# Patient Record
Sex: Female | Born: 1955
Health system: Southern US, Community
[De-identification: ages and names within clinical notes are randomized; demographics above are authoritative.]

## PROBLEM LIST (undated history)

## (undated) DIAGNOSIS — E039 Hypothyroidism, unspecified: Secondary | ICD-10-CM

## (undated) DIAGNOSIS — F32A Depression, unspecified: Secondary | ICD-10-CM

## (undated) DIAGNOSIS — R1031 Right lower quadrant pain: Secondary | ICD-10-CM

## (undated) DIAGNOSIS — K219 Gastro-esophageal reflux disease without esophagitis: Secondary | ICD-10-CM

## (undated) DIAGNOSIS — M1990 Unspecified osteoarthritis, unspecified site: Secondary | ICD-10-CM

## (undated) DIAGNOSIS — C801 Malignant (primary) neoplasm, unspecified: Secondary | ICD-10-CM

## (undated) DIAGNOSIS — I499 Cardiac arrhythmia, unspecified: Secondary | ICD-10-CM

## (undated) DIAGNOSIS — F329 Major depressive disorder, single episode, unspecified: Secondary | ICD-10-CM

## (undated) HISTORY — PX: TUBAL LIGATION: SHX77

## (undated) HISTORY — PX: CYST REMOVAL NECK: SHX6281

## (undated) HISTORY — DX: Right lower quadrant pain: R10.31

## (undated) HISTORY — PX: APPENDECTOMY: SHX54

---

## 2002-06-07 ENCOUNTER — Ambulatory Visit (HOSPITAL_COMMUNITY): Admission: RE | Admit: 2002-06-07 | Discharge: 2002-06-07 | Payer: Self-pay | Admitting: Family Medicine

## 2002-06-07 ENCOUNTER — Encounter: Payer: Self-pay | Admitting: Family Medicine

## 2002-09-12 HISTORY — PX: SUPRAVENTRICULAR TACHYCARDIA ABLATION: SHX6106

## 2002-09-13 ENCOUNTER — Ambulatory Visit (HOSPITAL_COMMUNITY): Admission: RE | Admit: 2002-09-13 | Discharge: 2002-09-14 | Payer: Self-pay | Admitting: Internal Medicine

## 2006-02-01 ENCOUNTER — Ambulatory Visit (HOSPITAL_COMMUNITY): Admission: RE | Admit: 2006-02-01 | Discharge: 2006-02-01 | Payer: Self-pay | Admitting: Family Medicine

## 2007-07-01 ENCOUNTER — Other Ambulatory Visit: Admission: RE | Admit: 2007-07-01 | Discharge: 2007-07-01 | Payer: Self-pay | Admitting: Obstetrics and Gynecology

## 2009-04-13 HISTORY — PX: THYROIDECTOMY, PARTIAL: SHX18

## 2009-08-06 ENCOUNTER — Ambulatory Visit (HOSPITAL_COMMUNITY): Admission: RE | Admit: 2009-08-06 | Discharge: 2009-08-06 | Payer: Self-pay | Admitting: Obstetrics and Gynecology

## 2009-08-06 ENCOUNTER — Encounter: Payer: Self-pay | Admitting: Orthopedic Surgery

## 2009-08-21 ENCOUNTER — Other Ambulatory Visit: Admission: RE | Admit: 2009-08-21 | Discharge: 2009-08-21 | Payer: Self-pay | Admitting: Obstetrics and Gynecology

## 2009-08-28 ENCOUNTER — Ambulatory Visit (HOSPITAL_COMMUNITY): Admission: RE | Admit: 2009-08-28 | Discharge: 2009-08-28 | Payer: Self-pay | Admitting: Obstetrics & Gynecology

## 2009-09-10 ENCOUNTER — Encounter: Payer: Self-pay | Admitting: Orthopedic Surgery

## 2009-09-11 ENCOUNTER — Ambulatory Visit: Payer: Self-pay | Admitting: Orthopedic Surgery

## 2009-09-11 DIAGNOSIS — M549 Dorsalgia, unspecified: Secondary | ICD-10-CM | POA: Insufficient documentation

## 2009-09-13 ENCOUNTER — Ambulatory Visit (HOSPITAL_COMMUNITY): Admission: RE | Admit: 2009-09-13 | Discharge: 2009-09-13 | Payer: Self-pay | Admitting: Obstetrics & Gynecology

## 2009-10-03 ENCOUNTER — Telehealth: Payer: Self-pay | Admitting: Orthopedic Surgery

## 2009-12-09 ENCOUNTER — Encounter (INDEPENDENT_AMBULATORY_CARE_PROVIDER_SITE_OTHER): Payer: Self-pay | Admitting: Surgery

## 2009-12-09 ENCOUNTER — Ambulatory Visit (HOSPITAL_COMMUNITY): Admission: RE | Admit: 2009-12-09 | Discharge: 2009-12-10 | Payer: Self-pay | Admitting: Surgery

## 2010-05-13 NOTE — Assessment & Plan Note (Signed)
Summary: LOW BACK PAIN XR AT [facility]   Vital Signs:  Patient profile:   55 year old female Height:      71 inches Weight:      174 pounds Pulse rate:   80 / minute Resp:     18 per minute  Vitals Entered By: Fuller Canada MD (September 11, 2009 3:21 PM)  Visit Type:  new patient Referring Provider:  Victorino Dike PA at [facility] Primary Provider:  [facility]  CC:  low back pain.  History of Present Illness: This is a 55 year old female with recent onset of lower back pain without any radiation.  It comes and goes it still mild to moderate in severity.  It came on gradually.  It started 4 weeks ago and occasionally will radiate into the upper LEFT leg.  No numbness no weakness.  Her pain is relieved with Mobic and Tylenol.  No injury.  Xrays at [facility] of the whole spine 08/06/09.  Meds: Meloxicam and Tylenol.        Allergies (verified): 1)  ! 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  The review of systems is negative for Cardiovascular, Respiratory, Gastrointestinal, Genitourinary, Neurologic, Musculoskeletal, Endocrine, Psychiatric, Skin, HEENT, Immunology, and Hemoatologic.  Physical Exam  Skin:  intact without lesions or rashes Inguinal Nodes:  no significant adenopathy Psych:  alert and cooperative; normal mood and affect; normal attention span and concentration   Detailed Back/Spine Exam  General:    Well-developed, well-nourished, in no acute distress; alert and oriented x 3.    Gait:    Normal heel-toe gait pattern bilaterally.    Skin:    Intact with no erythema; no scarring.    Inspection:    normal  Palpation:  normal lumbar spine palpation  Vascular:    dorsalis pedis and posterior tibial pulses 2+ and symmetric, capillary refill < 2 seconds, normal hair pattern, no evidence of ischemia.   Thoracic Exam:  Inspection-deformity:    Normal Palpation-spinal tenderness:  Normal Shoulder Prominence    Location: normal Pelvis Prominence    Location: none  Lumbosacral Exam:  Inspection-deformity:    Normal Palpation-spinal tenderness:  Normal Range of Motion:    Forward Flexion:   60 degrees    Hyperextension:   25 degrees    Schober's:        >6 cm    Right Lateral Bend:   25 degrees    Left Lateral Bend:   25 degrees Squatting:  normal Lying Straight Leg Raise:    Right:  negative    Left:  negative Sitting Straight Leg Raise:    Right:  negative    Left:  negative Reverse Straight Leg Raise:    Right:  negative    Left:  negative Contralateral Straight Leg Raise:    Right:  negative    Left:  negative Sciatic Notch:    There is no sciatic notch tenderness. Toe Walking:    Right:  normal    Left:  normal Heel Walking:    Right:  normal    Left:  normal Patrick's Maneuver:    Right:  negative    Left:  negative Fabere Test:  Right:  negative    Left:  negative   Impression & Recommendations:  Problem # 1:  BACK PAIN (ICD-724.5) Assessment New multiple views of the thoracic or lumbar spine were obtained at the hospital and they were all normal  I think she just has some garden-variety routine lower back pain.  I advised her if she gets numbness and tingling or pain radiating into the foot or the lower portion of the leg and that could be a sign of disc herniation and I would like to see her again for that  Orders: Physical Therapy Referral (PT) New Patient Level III ([address]109)  Patient Instructions: 1)  PHYSICAL THERAPIST FOR BACK PAIN HOME EXERCISE PROGRAM , 1 VISIT  2)  Please schedule a follow-up appointment as needed.

## 2010-05-13 NOTE — Progress Notes (Signed)
Summary: cannot go for her 1 session of PT until august?!  Phone Note Other Incoming Call back at PT [facility]   Summary of Call: said pt cannot come in for her 1 session of PT until August, just FYI Initial call taken by: Ether Griffins,  October 03, 2009 10:52 AM

## 2010-05-13 NOTE — Letter (Signed)
Summary: History form  History form   Imported By: Jacklynn Ganong 09/19/2009 15:15:20  _____________________________________________________________________  External Attachment:    Type:   Image     Comment:   External Document

## 2010-06-27 LAB — CBC
HCT: 39.8 % (ref 36.0–46.0)
MCH: 32.2 pg (ref 26.0–34.0)
MCHC: 34.4 g/dL (ref 30.0–36.0)
MCV: 93.7 fL (ref 78.0–100.0)
Platelets: 177 10*3/uL (ref 150–400)
RBC: 4.25 MIL/uL (ref 3.87–5.11)
RDW: 12.1 % (ref 11.5–15.5)

## 2010-06-27 LAB — BASIC METABOLIC PANEL
CO2: 30 mEq/L (ref 19–32)
Calcium: 9.3 mg/dL (ref 8.4–10.5)
Chloride: 105 mEq/L (ref 96–112)
Creatinine, Ser: 0.89 mg/dL (ref 0.4–1.2)
Glucose, Bld: 99 mg/dL (ref 70–99)
Sodium: 142 mEq/L (ref 135–145)

## 2010-06-27 LAB — URINALYSIS, ROUTINE W REFLEX MICROSCOPIC
Bilirubin Urine: NEGATIVE
Specific Gravity, Urine: 1.015 (ref 1.005–1.030)
pH: 7 (ref 5.0–8.0)

## 2010-06-27 LAB — PROTIME-INR
INR: 1.04 (ref 0.00–1.49)
Prothrombin Time: 13.8 seconds (ref 11.6–15.2)

## 2010-06-27 LAB — DIFFERENTIAL
Basophils Absolute: 0 10*3/uL (ref 0.0–0.1)
Eosinophils Relative: 1 % (ref 0–5)
Lymphs Abs: 1.9 10*3/uL (ref 0.7–4.0)
Monocytes Absolute: 0.3 10*3/uL (ref 0.1–1.0)

## 2010-06-27 LAB — SURGICAL PCR SCREEN: MRSA, PCR: NEGATIVE

## 2010-08-29 NOTE — Op Note (Signed)
NAME:  Colleen Torres, Colleen Torres                         ACCOUNT NO.:  [id number]   MEDICAL RECORD NO.:  [id number]                   PATIENT TYPE:  OIB   LOCATION:  2899                                 FACILITY:  [facility]   PHYSICIAN:  Doylene Canning. Ladona Ridgel, M.D.               DATE OF BIRTH:  November 29, 1955   DATE OF PROCEDURE:  09/13/2002  DATE OF DISCHARGE:                                 OPERATIVE REPORT   PROCEDURE:  Electrophysiologic study and RF catheter ablation of  supraventricular tachycardia.   INTRODUCTION:  The patient is a 55 year old woman who has been otherwise  healthy.  She has had tachypalpitations for several years.  Her heart rate  has been routinely elevated whenever she has her blood pressure checked with  heart rates in the 110 to 140 range.  She has palpitations with exertion,  increasing fatigue, and dyspnea.  She was subsequently found to have fairly  incessant SVT and is now referred for electrophysiologic study and catheter  ablation.  Of note, her EKG demonstrates a long RP tachycardia with inverted  T waves in the inferior leads.   DESCRIPTION OF PROCEDURE:  After informed consent was obtained, the patient  was taken to the diagnostic EP lab in the fasting state.  After the usual  preparation and draping, intravenous fentanyl and midazolam was given for  sedation.  A 6 French hexapolar catheter was inserted percutaneously in the  right jugular vein and advanced to the pulmonary sinus.  A 5 French  quadripolar catheter was inserted percutaneously in the right femoral vein  and advanced to the RV apex.  A 5 French quadripolar catheter was inserted  percutaneously in the right femoral vein and advanced to the His-bundle  region.  After measurement of the basic intervals, mapping was carried out  with the patient in incessant SVT.  This demonstrated a narrow QRS  tachycardia with a long RP duration at a cycle length of about 550  milliseconds.  The earliest atrial activation  was at the CS os.  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  Initial attempts at pacing  the ventricle and the atrium resulted in incessant initiation of SVT.  The  tachycardia would start spontaneously and stop spontaneously.  At this  point, the ablation catheter was maneuvered into the floor of the CS os just  outside the ostium in the coronary sinus on the inferior lip. A single RF  energy application delivered at this location resulted in termination of the  SVT.  A second bonus RF energy application was delivered and rapid atrial  and ventricular pacing and programmed atrial and ventricular stimulation was  then carried out.  After catheterization, there was no inducible  SVT.  The  catheters were then removed, hemostasis was assured, and the patient  returned to her room in satisfactory condition.   COMPLICATIONS:  There were no immediate procedure complications.   RESULTS:  A.  Baseline EKG.  The baseline EKG demonstrates SVT at 110 beats  per minute with a long RP interval.  B. Mapping of the P waves demonstrated negative P waves in the inferior  leads.  C. Rapid ventricular pacing.  Rapid ventricular pacing after catheter  ablation demonstrated a VA Wenckebach cycle length of 520 milliseconds.  Following catheter ablation, the atrial activation sequence was midline and  decremental.  D. Programmed ventricular stimulation.  Programmed ventricular stimulation  was carried out from the RV apex at basic drive cycle length of 045  milliseconds.  The S1 and S2 interval was stepwise decreased from 440  milliseconds down to 480 milliseconds where the retrograde AV nodal ERP was  observed.  During programmed ventricular stimulation after catheter  ablation,  there were no inducible SVT.  E. Rapid atrial pacing.  Rapid atrial pacing was carried out from the  coronary sinus demonstrated an AV Wenckebach cycle length of 370  milliseconds.  During rapid atrial pacing, the PR interval remained less  than the RR interval and there was no inducible SVT.  F. Programmed atrial stimulation.  Programmed atrial stimulation was carried  out from the coronary sinus at a basic drive cycle length of 409  milliseconds. The S1 and S2 interval was stepwise decreased from 440  milliseconds down to 370 milliseconds resulting in the AV nodal ERP.  During  programmed atrial stimulation, there were _______ and echo beats, but no  inducible SVT following catheter ablation.  G. Arrhythmias observed:  1) PJRT initiation present at time of EP study,  duration sustained, cycle length 530 milliseconds, duration sustained,  termination with catheter ablation.  Induction was also with programmed  atrial stimulation.  H. Mapping.  Mapping of the earliest atrial activation occurred in the os of  the coronary sinus just inferior to this region in the posteroseptal space  of the right atrium.  A. RF energy application.  A single RF energy application resulted in     termination of SVT.  A single bonus RF energy application was delivered     as well.  The patient was observed for approximately 50 minutes.   CONCLUSION:  The study demonstrates incessant and easily inducible PJRT  (permanent form of junctional reciprocating tachycardia).  This demonstrates  successful RF catheter ablation with a total of two RF energy applications  delivered at the posterior septal space resulting in termination of the  tachycardia, restoration of sinus rhythm, and resulting in rendering the  tachycardia noninducible.                                               Doylene Canning. Ladona Ridgel, M.D.    GWT/MEDQ  D:  09/13/2002  T:  09/13/2002  Job:  [id number]   cc:   Kem Boroughs, M.D. [phone] [address],  [address]  [place]  [place] [address]  Fax: [phone]   Kirk Ruths, M.D.  [address]  [place]  [place] [address]  Fax: [phone]   Kathrine Cords, R.N. [facility]

## 2010-08-29 NOTE — Discharge Summary (Signed)
   NAME:  Colleen Torres, Colleen Torres                         ACCOUNT NO.:  [id number]   MEDICAL RECORD NO.:  [id number]                   PATIENT TYPE:  OIB   LOCATION:  3735                                 FACILITY:  [facility]   PHYSICIAN:  Doylene Canning. Ladona Ridgel, M.D.               DATE OF BIRTH:  03-19-1956   DATE OF ADMISSION:  09/13/2002  DATE OF DISCHARGE:  09/14/2002                                 DISCHARGE SUMMARY   PRIMARY DIAGNOSIS:  Supraventricular tachycardia (SVT.   HISTORY AND HOSPITAL COURSE:  This is a 55 year old female who was well  until approximately 2-3 years ago when she noted the new onset of  tachycardia that would occur typically with stressful situations and when  she had not had a lot of stress, the palpitations became less symptomatic.  Over the last several months, she has been increasingly fatigued and tired.  During this period, when she went to the store, she had her blood pressure  checked and was found to have heart rates in the 110-120 range at rest.  Subsequent evaluation demonstrated intermittent episodes of __________ QRS  tachycardia which were notable to have a P-wave morphology that was negative  in the inferior leads with a long RT tachycardia.  The tachycardia, at  times, had been incessant.  She was started on a beta blocker.  The symptoms  improved although she feels somewhat weak and tired.  Cardiac monitor was  worn demonstrating recurrent episodes of tachycardia with rates up to 170.  The patient was admitted for SVT ablation.  On June 2, the patient underwent  a radiofrequency ablation of her SVT.  She tolerated the procedures well.  She had no further episodes during her hospitalization.  She had an  uncomplicated evening and was discharged to home the following day.   DISPOSITION:  She was discharged on antibiotic prophylaxis only.  She was to  stop her beta blocker and aspirin.  She was to take Tylenol 1-2 tablets  every 4-6 hours as needed for pain.   No heavy lifting or strenuous activity  for four days.  No driving for two days.  Low-fat, low-cholesterol, no  caffeine diet.  She was to call if she developed a lump or any drainage in  her groin.  She was to follow with Dr. Lewayne Bunting on July 13 at 4 p.m., in  the [place] office.     [name], C.R.N.P. [facility]                 Doylene Canning. Ladona Ridgel, M.D.    DS/MEDQ  D:  09/14/2002  T:  09/14/2002  Job:  [id number]   cc:   Doylene Canning. Ladona Ridgel, M.D.   Kathrine Cords, R.N. [facility]   Kem Boroughs, M.D.  [address]  [place]  [place] [address]  Fax: [phone]

## 2010-09-02 ENCOUNTER — Other Ambulatory Visit (HOSPITAL_COMMUNITY)
Admission: RE | Admit: 2010-09-02 | Discharge: 2010-09-02 | Disposition: A | Discharge status: Home / Self Care | Payer: Self-pay | Source: Ambulatory Visit | Attending: Obstetrics and Gynecology | Admitting: Obstetrics and Gynecology

## 2010-09-02 ENCOUNTER — Other Ambulatory Visit: Payer: Self-pay | Admitting: Adult Health

## 2010-09-02 DIAGNOSIS — Z01419 Encounter for gynecological examination (general) (routine) without abnormal findings: Secondary | ICD-10-CM | POA: Insufficient documentation

## 2011-08-29 IMAGING — CR DG CERVICAL SPINE COMPLETE 4+V
5 series · 5 of 5 positions shown · non-contrast
Comparison: 02/01/2006

CLINICAL DATA: Neck pain.

CERVICAL SPINE - COMPLETE 4+ VIEW

[view not recorded (1 of 5)]
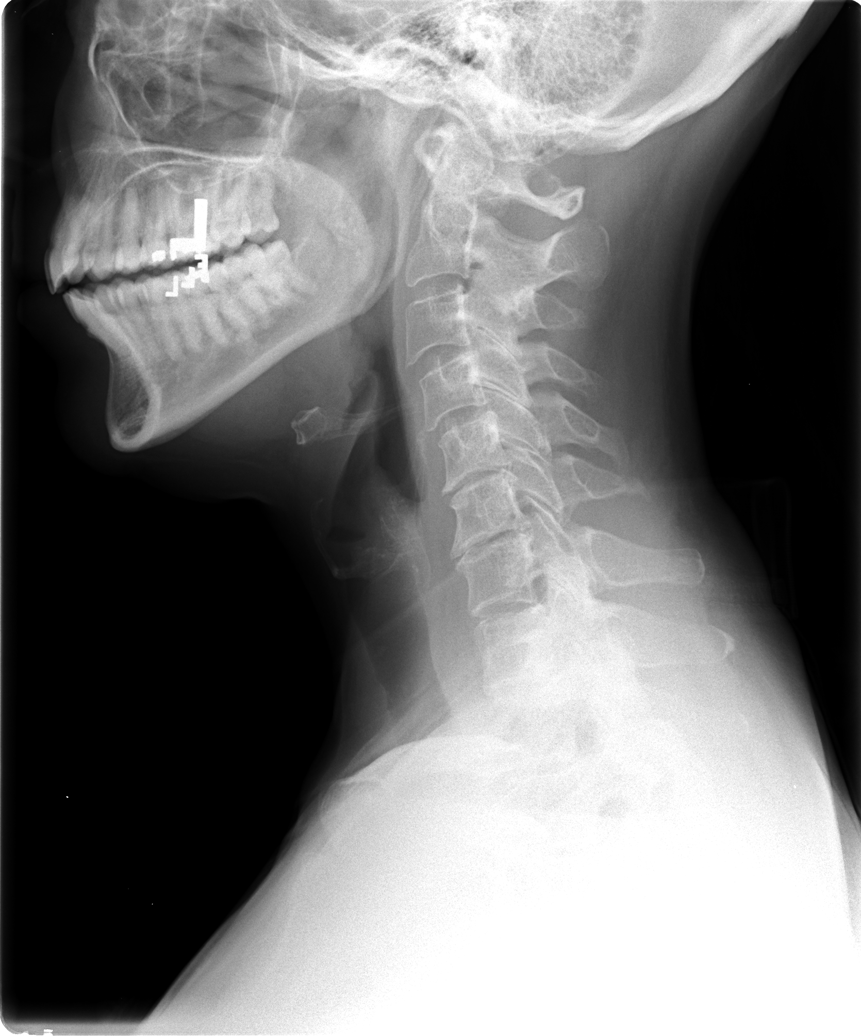

[view not recorded (2 of 5)]
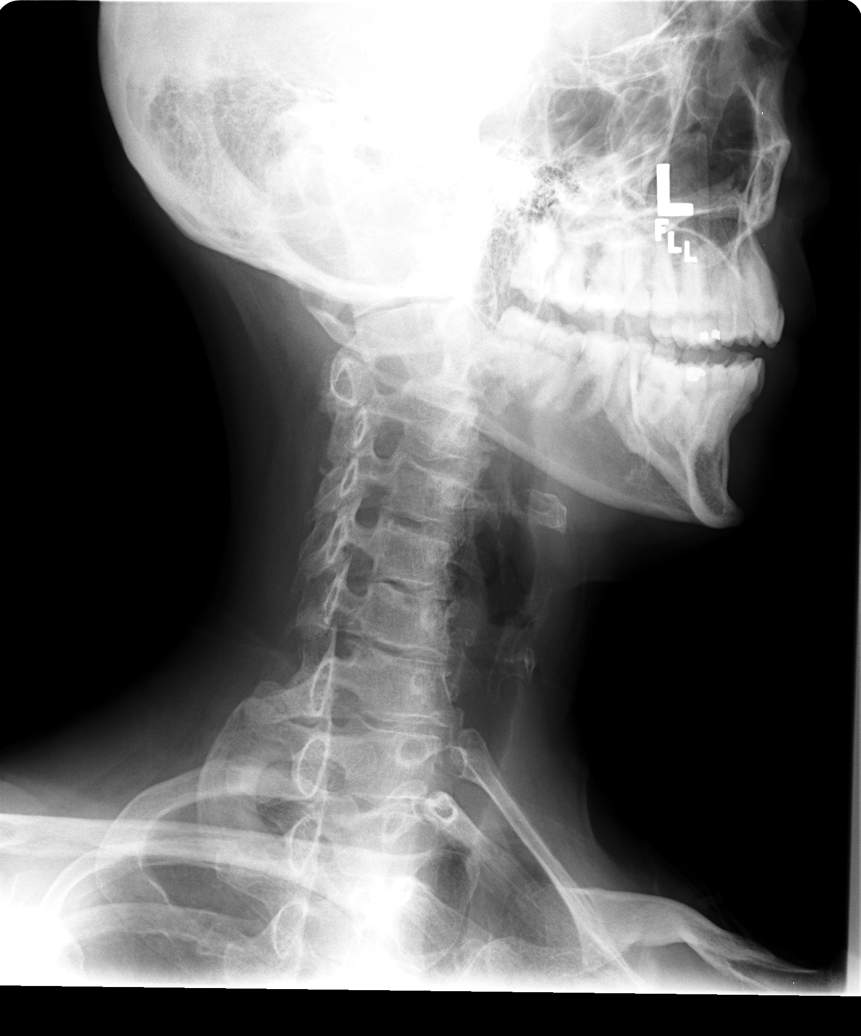

[view not recorded (3 of 5)]
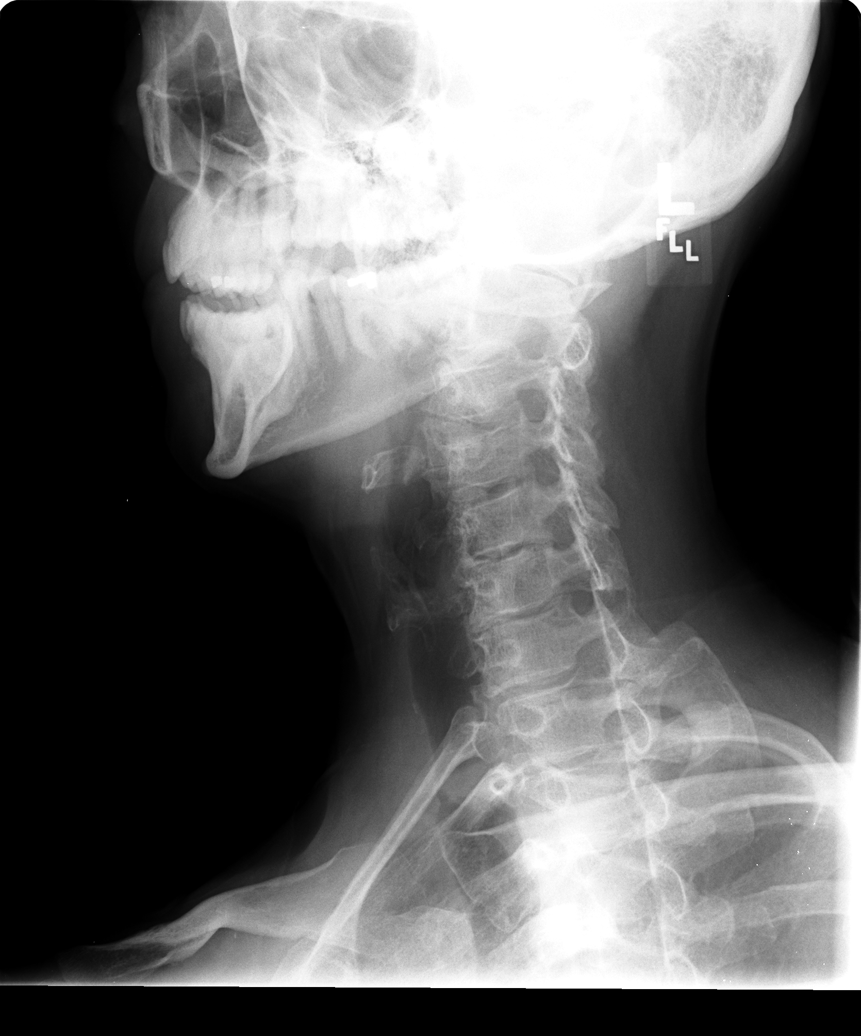

[view not recorded (4 of 5)]
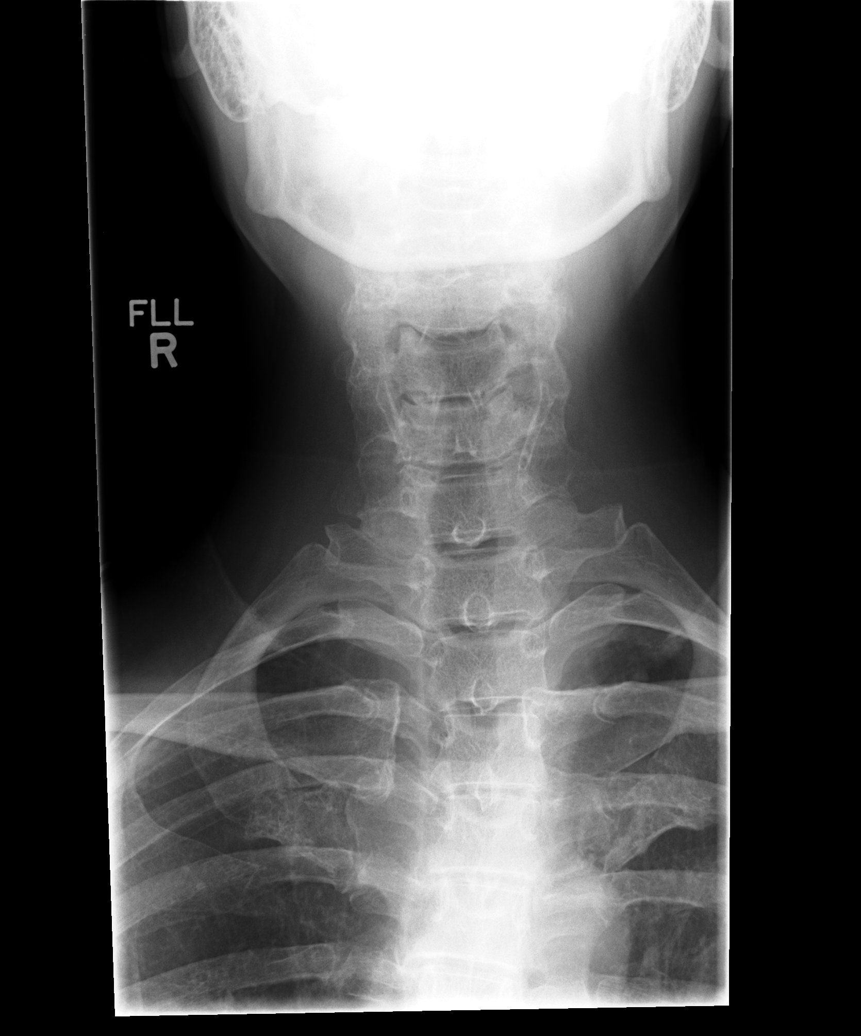

[view not recorded (5 of 5)]
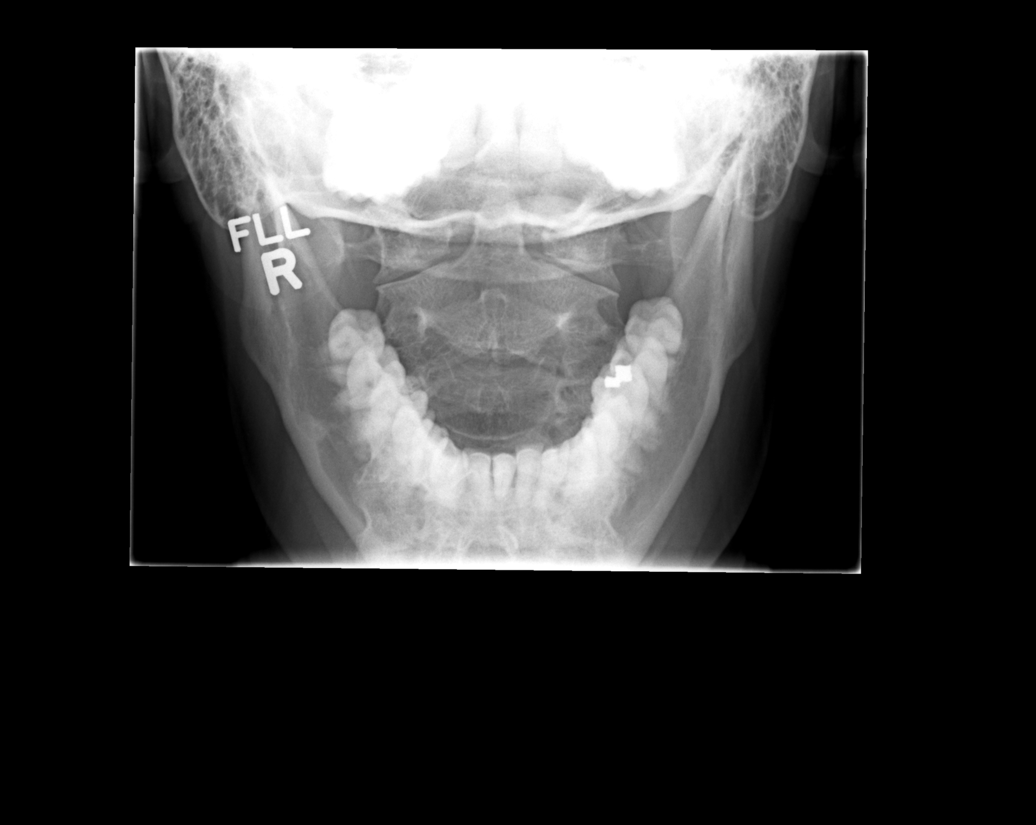

[5 of 5 positions shown; findings below may reference images not displayed]

FINDINGS: No evidence of cervical spine fracture or prevertebral
soft tissue swelling.  Mild to moderate degenerative disc disease
is again seen at C5-6, C6-7, and C7-T1.  Minimal degenerative
retrolisthesis again noted at C2-3 measuring approximately 2 mm.
Loss of normal cervical lordosis again noted.
IMPRESSION: 1.  No acute findings.
2.  Stable cervical spondylosis, as described above.

## 2011-09-09 ENCOUNTER — Other Ambulatory Visit (HOSPITAL_COMMUNITY)
Admission: RE | Admit: 2011-09-09 | Discharge: 2011-09-09 | Disposition: A | Discharge status: Home / Self Care | Payer: PRIVATE HEALTH INSURANCE | Source: Ambulatory Visit | Attending: Obstetrics and Gynecology | Admitting: Obstetrics and Gynecology

## 2011-09-09 ENCOUNTER — Other Ambulatory Visit: Payer: Self-pay | Admitting: Adult Health

## 2011-09-09 DIAGNOSIS — Z01419 Encounter for gynecological examination (general) (routine) without abnormal findings: Secondary | ICD-10-CM | POA: Insufficient documentation

## 2011-09-09 DIAGNOSIS — Z1159 Encounter for screening for other viral diseases: Secondary | ICD-10-CM | POA: Insufficient documentation

## 2014-01-19 ENCOUNTER — Other Ambulatory Visit (HOSPITAL_COMMUNITY): Payer: Self-pay | Admitting: "Endocrinology

## 2014-01-19 DIAGNOSIS — E041 Nontoxic single thyroid nodule: Secondary | ICD-10-CM

## 2014-01-25 ENCOUNTER — Other Ambulatory Visit (HOSPITAL_COMMUNITY): Payer: Self-pay | Admitting: "Endocrinology

## 2014-01-25 ENCOUNTER — Ambulatory Visit (HOSPITAL_COMMUNITY)
Admission: RE | Admit: 2014-01-25 | Discharge: 2014-01-25 | Disposition: A | Discharge status: Home / Self Care | Payer: PRIVATE HEALTH INSURANCE | Source: Ambulatory Visit | Attending: "Endocrinology | Admitting: "Endocrinology

## 2014-01-25 ENCOUNTER — Encounter (HOSPITAL_COMMUNITY): Payer: Self-pay

## 2014-01-25 ENCOUNTER — Other Ambulatory Visit: Payer: Self-pay | Admitting: Adult Health

## 2014-01-25 VITALS — BP 114/72 | HR 60 | Temp 98.0°F | Resp 16

## 2014-01-25 DIAGNOSIS — E041 Nontoxic single thyroid nodule: Secondary | ICD-10-CM | POA: Insufficient documentation

## 2014-01-25 DIAGNOSIS — Z139 Encounter for screening, unspecified: Secondary | ICD-10-CM

## 2014-01-25 MED ORDER — LIDOCAINE HCL (PF) 2 % IJ SOLN
10.0000 mL | Freq: Once | INTRAMUSCULAR | Status: AC
  Administered 2014-01-25: 10 mL

## 2014-01-25 MED ORDER — LIDOCAINE HCL (PF) 2 % IJ SOLN
INTRAMUSCULAR | Status: AC
  Filled 2014-01-25: qty 10

## 2014-01-25 NOTE — Discharge Instructions (Signed)
Thyroid Biopsy °The thyroid gland is a butterfly-shaped gland situated in the front of the neck. It produces hormones which affect metabolism, growth and development, and body temperature. A thyroid biopsy is a procedure in which small samples of tissue or fluid are removed from the thyroid gland or mass and examined under a microscope. This test is done to determine the cause of thyroid problems, such as infection, cancer, or other thyroid problems. °There are 2 ways to obtain samples: °1. Fine needle biopsy. Samples are removed using a thin needle inserted through the skin and into the thyroid gland or mass. °2. Open biopsy. Samples are removed after a cut (incision) is made through the skin. °LET YOUR CAREGIVER KNOW ABOUT:  °· Allergies. °· Medications taken including herbs, eye drops, over-the-counter medications, and creams. °· Use of steroids (by mouth or creams). °· Previous problems with anesthetics or numbing medicine. °· Possibility of pregnancy, if this applies. °· History of blood clots (thrombophlebitis). °· History of bleeding or blood problems. °· Previous surgery. °· Other health problems. °RISKS AND COMPLICATIONS °· Bleeding from the site. The risk of bleeding is higher if you have a bleeding disorder or are taking any blood thinning medications (anticoagulants). °· Infection. °· Injury to structures near the thyroid gland. °BEFORE THE PROCEDURE  °This is a procedure that can be done as an outpatient. Confirm the time that you need to arrive for your procedure. Confirm whether there is a need to fast or withhold any medications. A blood sample may be done to determine your blood clotting time. Medicine may be given to help you relax (sedative). °PROCEDURE °Fine needle biopsy. °You will be awake during the procedure. You may be asked to lie on your back with your head tipped backward to extend your neck. Let your caregiver know if you cannot tolerate the positioning. An area on your neck will be  cleansed. A needle is inserted through the skin of your neck. You may feel a mild discomfort during this procedure. You may be asked to avoid coughing, talking, swallowing, or making sounds during some portions of the procedure. The needle is withdrawn once tissue or fluid samples have been removed. Pressure may be applied to the neck to reduce swelling and ensure that bleeding has stopped. The samples will be sent for examination.  °Open biopsy. °You will be given general anesthesia. You will be asleep during the procedure. An incision is made in your neck. A sample of thyroid tissue or the mass is removed. The tissue sample or mass will be sent for examination. The sample or mass may be examined during the biopsy. If the sample or mass contains cancer cells, some or all of the thyroid gland may be removed. The incision is closed with stitches. °AFTER THE PROCEDURE  °Your recovery will be assessed and monitored. If there are no problems, as an outpatient, you should be able to go home shortly after the procedure. °If you had a fine needle biopsy: °· You may have soreness at the biopsy site for 1 to 2 days. °If you had an open biopsy:  °· You may have soreness at the biopsy site for 3 to 4 days. °· You may have a hoarse voice or sore throat for 1 to 2 days. °Obtaining the Test Results °It is your responsibility to obtain your test results. Do not assume everything is normal if you have not heard from your caregiver or the medical facility. It is important for you to follow up   on all of your test results. °HOME CARE INSTRUCTIONS  °· Keeping your head raised on a pillow when you are lying down may ease biopsy site discomfort. °· Supporting the back of your head and neck with both hands as you sit up from a lying position may ease biopsy site discomfort. °· Only take over-the-counter or prescription medicines for pain, discomfort, or fever as directed by your caregiver. °· Throat lozenges or gargling with warm salt  water may help to soothe a sore throat. °SEEK IMMEDIATE MEDICAL CARE IF:  °· You have severe bleeding from the biopsy site. °· You have difficulty swallowing. °· You have a fever. °· You have increased pain, swelling, redness, or warmth at the biopsy site. °· You notice pus coming from the biopsy site. °· You have swollen glands (lymph nodes) in your neck. °Document Released: 01/25/2007 Document Revised: 07/25/2012 Document Reviewed: 06/22/2013 °ExitCare® Patient Information ©2015 [employer]. This information is not intended to replace advice given to you by your health care provider. Make sure you discuss any questions you have with your health care provider. ° °

## 2014-02-07 ENCOUNTER — Ambulatory Visit (HOSPITAL_COMMUNITY)
Admission: RE | Admit: 2014-02-07 | Discharge: 2014-02-07 | Disposition: A | Discharge status: Home / Self Care | Payer: PRIVATE HEALTH INSURANCE | Source: Ambulatory Visit | Attending: Adult Health | Admitting: Adult Health

## 2014-02-07 DIAGNOSIS — Z1231 Encounter for screening mammogram for malignant neoplasm of breast: Secondary | ICD-10-CM | POA: Diagnosis not present

## 2014-02-07 DIAGNOSIS — R928 Other abnormal and inconclusive findings on diagnostic imaging of breast: Secondary | ICD-10-CM | POA: Insufficient documentation

## 2014-02-07 DIAGNOSIS — Z139 Encounter for screening, unspecified: Secondary | ICD-10-CM

## 2014-02-09 ENCOUNTER — Other Ambulatory Visit: Payer: Self-pay | Admitting: Adult Health

## 2014-02-09 DIAGNOSIS — R928 Other abnormal and inconclusive findings on diagnostic imaging of breast: Secondary | ICD-10-CM

## 2014-02-27 ENCOUNTER — Ambulatory Visit (HOSPITAL_COMMUNITY)
Admission: RE | Admit: 2014-02-27 | Discharge: 2014-02-27 | Disposition: A | Discharge status: Home / Self Care | Payer: PRIVATE HEALTH INSURANCE | Source: Ambulatory Visit | Attending: Adult Health | Admitting: Adult Health

## 2014-02-27 DIAGNOSIS — R928 Other abnormal and inconclusive findings on diagnostic imaging of breast: Secondary | ICD-10-CM | POA: Insufficient documentation

## 2014-02-28 NOTE — H&P (Signed)
NTS SOAP Note  Vital Signs:  Vitals as of: 43/15/4008: Systolic 676: Diastolic 82: Heart Rate 75: Temp 97.65F: Height 76ft 9.5in: Weight 192Lbs 0 Ounces: BMI 27.95  BMI : 27.95 kg/m2  Subjective: This 58 year old female presents for of a thyroid nodule.  Found on ultrasound of the thryoid gland for workup of dysphagia.  s/p left thyroid lobectomy for multinodular goiter.  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  No voice changes noted.  No heart palpitations recently,  s/p cardiac ablation.  Review of Symptoms:  Constitutional:unremarkable   Head:unremarkable Eyes:unremarkable   Nose/Mouth/Throat:unremarkable Cardiovascular:  unremarkable Respiratory:unremarkable Gastrointestinal:  unremarkable   Genitourinary:unremarkable   neck pain Skin:unremarkable Hematolgic/Lymphatic:unremarkable   Allergic/Immunologic:unremarkable   Past Medical History:  Reviewed  Past Medical History  Surgical History: left thyroid lobectomy,  cardiac ablation,  appendectomy Medical Problems: hypothyroidism Allergies: nkda Medications: synthroid,  nabumetane   Social History:Reviewed  Social History  Preferred Language: English Race:  White Ethnicity: Not Hispanic / Latino Age: 89 year Marital Status:  M Alcohol: no   Smoking Status: Never smoker reviewed on 02/20/2014 Functional Status reviewed on 02/20/2014 ------------------------------------------------ Bathing: Normal Cooking: Normal Dressing: Normal Driving: Normal Eating: Normal Managing Meds: Normal Oral Care: Normal Shopping: Normal Toileting: Normal Transferring: Normal Walking: Normal Cognitive Status reviewed on 02/20/2014 ------------------------------------------------ Attention: Normal Decision Making: Normal Language: Normal Memory: Normal Motor: Normal Perception: Normal Problem Solving: Normal Visual and Spatial: Normal   Family History:Reviewed  Family Health  History Family History is Unknown    Objective Information: General:Well appearing, well nourished in no distress. Surgical scar present.  No specific nodule palpable. Heart:RRR, no murmur or gallop.  Normal S1, S2.  No S3, S4.  Lungs:  CTA bilaterally, no wheezes, rhonchi, rales.  Breathing unlabored. U/S of thryoid gland:  1.3cm right lobe nodule.  Assessment:Follicular neoplasm,  right lobe of thryoid  Diagnoses: 195.0  D32.6 Follicular neoplasm of thyroid (Neoplasm of unspecified behavior of endocrine glands and other parts of nervous system)  Procedures: 71245 - OFFICE OUTPATIENT NEW 30 MINUTES    Plan:  Recommend right thyroid lobectomy.  Patient understands and agrees.  Will call to schdule surgery.   Patient Education:Alternative treatments to surgery were discussed with patient (and family).  Risks and benefits  of procedure including bleeding,  infection,  nerve injury,  and dysphagia were fully explained to the patient (and family) who gave informed consent. Patient/family questions were addressed.  Follow-up:Pending Surgery

## 2014-03-13 NOTE — Patient Instructions (Signed)
Colleen Torres  03/13/2014   Your procedure is scheduled on:  03/19/2014  Report to [facility] at 7:30 AM.  Call this number if you have problems the morning of surgery: [phone]   Remember:   Do not eat food or drink liquids after midnight.   Take these medicines the morning of surgery with A SIP OF WATER: SYNTHROID   Do not wear jewelry, make-up or nail polish.  Do not wear lotions, powders, or perfumes. You may wear deodorant.  Do not shave 48 hours prior to surgery. Men may shave face and neck.  Do not bring valuables to the hospital.  [facility] is not responsible for any belongings or valuables.               Contacts, dentures or bridgework may not be worn into surgery.  Leave suitcase in the car. After surgery it may be brought to your room.  For patients admitted to the hospital, discharge time is determined by your treatment team.               Patients discharged the day of surgery will not be allowed to drive home.  Name and phone number of your driver:   Special Instructions: Shower using CHG 2 nights before surgery and the night before surgery.  If you shower the day of surgery use CHG.  Use special wash - you have one bottle of CHG for all showers.  You should use approximately 1/3 of the bottle for each shower.   Please read over the following fact sheets that you were given: Surgical Site Infection Prevention and Anesthesia Post-op Instructions   PATIENT INSTRUCTIONS POST-ANESTHESIA  IMMEDIATELY FOLLOWING SURGERY:  Do not drive or operate machinery for the first twenty four hours after surgery.  Do not make any important decisions for twenty four hours after surgery or while taking narcotic pain medications or sedatives.  If you develop intractable nausea and vomiting or a severe headache please notify your doctor immediately.  FOLLOW-UP:  Please make an appointment with your surgeon as instructed. You do not need to follow up with anesthesia unless specifically  instructed to do so.  WOUND CARE INSTRUCTIONS (if applicable):  Keep a dry clean dressing on the anesthesia/puncture wound site if there is drainage.  Once the wound has quit draining you may leave it open to air.  Generally you should leave the bandage intact for twenty four hours unless there is drainage.  If the epidural site drains for more than 36-48 hours please call the anesthesia department.  QUESTIONS?:  Please feel free to call your physician or the hospital operator if you have any questions, and they will be happy to assist you.      Lobectomy, Thyroid, Adult A thyroid lobectomy is an operation done to remove a part or a whole section (lobe) of the thyroid gland. The thyroid gland is located in the front of the lower neck. The 2 lobes of the thyroid gland are located on the either side of windpipe (trachea) and are joined together by a tissue (the thyroid isthmus). The thyroid gland produces chemicals (hormones) that control your body's metabolism. Surgery may be needed to remove non-cancerous (benign) or cancerous (malignant) tumors of the thyroid. Surgery may also be needed when medicine alone cannot control overproduction of the thyroid hormone. LET YOUR CAREGIVER KNOW ABOUT:   Allergies to dyes, iodine, foods and/or latex.  Medications taken including herbs, eye drops, prescription medicines (especially medicines used to "  thin the blood"), aspirin or other over-the-counter medications and steroids (by mouth or as a cream).  Previous surgery.  Previous problems with anesthetics or medicines used to numb the skin.  Possibility of pregnancy, if this applies.  History of blood clots in your legs and/or lungs.  History of bleeding or blood problems.  Other important health problems. RISKS AND COMPLICATIONS Risks and complications of thyroid lobectomy are rare. The possible complications include:  Infection.  Bleeding.  Hoarseness of voice.  Difficulty in swallowing  and/or eating.  Damage to the glands that regulate the calcium level in the body (parathyroid glands).  Scar formation. BEFORE THE PROCEDURE  Please see "Pre-Surgical and Post-Surgical Guidelines for Patients". Your caregiver will help with any special instructions. Usually, you will be asked not to eat or drink for at least 8 hours prior to the surgery. Your caregiver may ask for certain tests before the surgery. These may include the following:  Blood tests to rule out bleeding problems.  A chest X-ray.  A test that records the electrical activity of the heart (electrocardiogram).  Tests to evaluate the functioning of your voice box (vocal cords).  A thyroid scan to rule out any cancer. You may be asked to stop aspirin, any "blood thinner" medicines and ibuprofen before the surgery. If you are taking medicine to reduce the amount of thyroid hormone your body makes, be sure to take this medicine up until the day of surgery, or as instructed by your caregiver. PROCEDURE  The surgery is usually done under general anesthesia. If so, you will be asleep during the procedure. The surgeon will make an cut (incision) 3 to 4 inches long just above where your collarbone meets the breastbone. The thyroid gland is found by moving the muscles to the side. The diseased part of the gland will be identified and removed. During the procedure, great care is taken to find and preserve the nerves of your voice box. The same is done to find and protect the 4 parathyroid glands located close to the thyroid gland. The removed part of the thyroid is sent to the lab and examined under a microscope. If no cancer is found, the surgery is done and sutures are used to close the skin. If cancer is found, total removal of the thyroid gland may be necessary. Some or all of the lymph nodes in the neck may also need to be removed. A small drainage tube may be placed in the neck. It helps prevent any buildup of fluid or blood at  the surgery site. AFTER THE PROCEDURE   The procedure will usually last 1 to 3 hours. After the surgery, you will be moved to the recovery room. After the anesthesia has worn off, you will be moved to a hospital room.  You might feel some pain or soreness in your throat. This may last for 3 to 7 days and can be helped with medicines.  After you are in the hospital room, you may be able to start drinking liquids. If liquids are tolerated, you will slowly return to you usual diet.  Blood levels of calcium (controlled by the parathyroid glands) will be checked. Even though the parathyroid glands were not removed, they may go into "shock" for a short time and your blood level of calcium may drop.  After thyroid lobectomy, you will usually stay in the hospital for a day and get discharged on the morning after surgery. HOME CARE INSTRUCTIONS   You can eat or  drink anything you like depending on your comfort.  Slowly return to normal activities beginning on the day after your surgery.  You can shower or bathe about 24 hours after surgery or after the drain is removed. Be careful to pat the area around the wound dry, if it is wet.  In between showing or bathing, a dressing is usually kept over the incision. The dressing will be removed after 2 to 3 days. Follow your caregiver's instructions.  Take prescribed medicines as advised.  Only take over-the-counter or prescription medicines for pain, discomfort or fever as directed by your caregiver. Do not take aspirin unless directed by your surgeon. Aspirin increases the chances of bleeding.  Your surgeon may advise you not to lift heavy objects or play strenuous sports for at least 10 days.  You may be told not drive for at least 1 week following surgery. Because muscles of the neck will be stiff and sore, it is difficult to safely look to the sides or behind you while driving. It is also best to avoid driving while taking prescription pain  medicine.  Usually, a follow-up office or clinic visit will be scheduled 7 to 14 days after surgery. Talk to your caregiver about follow-up appointments. SEEK MEDICAL CARE IF:   You have an oral temperature above 102 F (38.9 C).  You develop bleeding, redness, drainage, swelling or excessive pain at the surgery site.  The hoarseness in your voice persists more than 7 to 10 days.  You feel that you are having problems with any medicine. SEEK IMMEDIATE MEDICAL CARE IF:   You develop increased swelling in the neck.  You have an oral temperature above 102 F (38.9 C), not controlled by medicine.  You develop a feeling of "squeezing" or "constriction" in your throat that makes it hard to breathe. Document Released: 04/19/2007 Document Revised: 09/29/2011 Document Reviewed: 04/19/2007 [facility] Patient Information 2015 [place], [place]. This information is not intended to replace advice given to you by your health care provider. Make sure you discuss any questions you have with your health care provider.

## 2014-03-14 ENCOUNTER — Encounter (HOSPITAL_COMMUNITY)
Admission: RE | Admit: 2014-03-14 | Discharge: 2014-03-14 | Disposition: A | Discharge status: Home / Self Care | Payer: PRIVATE HEALTH INSURANCE | Source: Ambulatory Visit | Attending: General Surgery | Admitting: General Surgery

## 2014-03-14 ENCOUNTER — Encounter (HOSPITAL_COMMUNITY): Payer: Self-pay

## 2014-03-14 DIAGNOSIS — Z01812 Encounter for preprocedural laboratory examination: Secondary | ICD-10-CM | POA: Diagnosis present

## 2014-03-14 HISTORY — DX: Major depressive disorder, single episode, unspecified: F32.9

## 2014-03-14 HISTORY — DX: Hypothyroidism, unspecified: E03.9

## 2014-03-14 HISTORY — DX: Unspecified osteoarthritis, unspecified site: M19.90

## 2014-03-14 HISTORY — DX: Cardiac arrhythmia, unspecified: I49.9

## 2014-03-14 HISTORY — DX: Malignant (primary) neoplasm, unspecified: C80.1

## 2014-03-14 HISTORY — DX: Depression, unspecified: F32.A

## 2014-03-14 HISTORY — DX: Gastro-esophageal reflux disease without esophagitis: K21.9

## 2014-03-14 LAB — COMPREHENSIVE METABOLIC PANEL
ALT: 23 U/L (ref 0–35)
ANION GAP: 11 (ref 5–15)
AST: 23 U/L (ref 0–37)
Albumin: 4.1 g/dL (ref 3.5–5.2)
Alkaline Phosphatase: 55 U/L (ref 39–117)
BUN: 15 mg/dL (ref 6–23)
CALCIUM: 9.3 mg/dL (ref 8.4–10.5)
CO2: 29 meq/L (ref 19–32)
CREATININE: 0.79 mg/dL (ref 0.50–1.10)
Chloride: 101 mEq/L (ref 96–112)
GFR calc Af Amer: >90 mL/min (ref >90)
GFR calc non Af Amer: >90 mL/min (ref >90)
GLUCOSE: 85 mg/dL (ref 70–99)
Potassium: 4.5 mEq/L (ref 3.7–5.3)
Sodium: 141 mEq/L (ref 137–147)
Total Bilirubin: 0.5 mg/dL (ref 0.3–1.2)
Total Protein: 7.1 g/dL (ref 6.0–8.3)

## 2014-03-14 LAB — CBC WITH DIFFERENTIAL/PLATELET
BASOS ABS: 0 10*3/uL (ref 0.0–0.1)
Basophils Relative: 0 % (ref 0–1)
EOS PCT: 2 % (ref 0–5)
Eosinophils Absolute: 0.1 10*3/uL (ref 0.0–0.7)
HEMATOCRIT: 41 % (ref 36.0–46.0)
Hemoglobin: 14.5 g/dL (ref 12.0–15.0)
LYMPHS PCT: 31 % (ref 12–46)
Lymphs Abs: 1.4 10*3/uL (ref 0.7–4.0)
MCH: 32.2 pg (ref 26.0–34.0)
MCHC: 35.4 g/dL (ref 30.0–36.0)
MCV: 91.1 fL (ref 78.0–100.0)
MONO ABS: 0.3 10*3/uL (ref 0.1–1.0)
Monocytes Relative: 7 % (ref 3–12)
Neutro Abs: 2.7 10*3/uL (ref 1.7–7.7)
Neutrophils Relative %: 60 % (ref 43–77)
Platelets: 158 10*3/uL (ref 150–400)
RBC: 4.5 MIL/uL (ref 3.87–5.11)
RDW: 11.9 % (ref 11.5–15.5)
WBC: 4.6 10*3/uL (ref 4.0–10.5)

## 2014-03-19 ENCOUNTER — Observation Stay (HOSPITAL_COMMUNITY)
Admission: RE | Admit: 2014-03-19 | Discharge: 2014-03-20 | Disposition: A | Discharge status: Home / Self Care | Payer: PRIVATE HEALTH INSURANCE | Source: Ambulatory Visit | Attending: General Surgery | Admitting: General Surgery

## 2014-03-19 ENCOUNTER — Ambulatory Visit (HOSPITAL_COMMUNITY): Payer: PRIVATE HEALTH INSURANCE | Admitting: Anesthesiology

## 2014-03-19 ENCOUNTER — Encounter (HOSPITAL_COMMUNITY)
Admission: RE | Disposition: A | Discharge status: Home / Self Care | Payer: Self-pay | Source: Ambulatory Visit | Attending: General Surgery

## 2014-03-19 ENCOUNTER — Encounter (HOSPITAL_COMMUNITY): Payer: Self-pay | Admitting: *Deleted

## 2014-03-19 DIAGNOSIS — D497 Neoplasm of unspecified behavior of endocrine glands and other parts of nervous system: Secondary | ICD-10-CM | POA: Diagnosis present

## 2014-03-19 DIAGNOSIS — E039 Hypothyroidism, unspecified: Secondary | ICD-10-CM | POA: Diagnosis not present

## 2014-03-19 DIAGNOSIS — E063 Autoimmune thyroiditis: Principal | ICD-10-CM | POA: Insufficient documentation

## 2014-03-19 HISTORY — PX: THYROIDECTOMY: SHX17

## 2014-03-19 SURGERY — THYROIDECTOMY
Anesthesia: General | Laterality: Right

## 2014-03-19 MED ORDER — BUPIVACAINE HCL (PF) 0.5 % IJ SOLN
INTRAMUSCULAR | Status: DC | PRN
  Administered 2014-03-19: 4 mL

## 2014-03-19 MED ORDER — HYDROMORPHONE HCL 1 MG/ML IJ SOLN
1.0000 mg | INTRAMUSCULAR | Status: DC | PRN
  Administered 2014-03-19: 1 mg via INTRAVENOUS
  Filled 2014-03-19: qty 1

## 2014-03-19 MED ORDER — LEVOTHYROXINE SODIUM 25 MCG PO TABS
125.0000 ug | ORAL_TABLET | Freq: Every day | ORAL | Status: DC
  Administered 2014-03-20: 125 ug via ORAL
  Filled 2014-03-19 (×2): qty 1

## 2014-03-19 MED ORDER — SUCCINYLCHOLINE CHLORIDE 20 MG/ML IJ SOLN
INTRAMUSCULAR | Status: AC
  Filled 2014-03-19: qty 1

## 2014-03-19 MED ORDER — FENTANYL CITRATE 0.05 MG/ML IJ SOLN
INTRAMUSCULAR | Status: AC
  Filled 2014-03-19: qty 5

## 2014-03-19 MED ORDER — ONDANSETRON HCL 4 MG/2ML IJ SOLN
4.0000 mg | Freq: Once | INTRAMUSCULAR | Status: AC | PRN
  Administered 2014-03-19: 4 mg via INTRAVENOUS

## 2014-03-19 MED ORDER — BUPIVACAINE HCL (PF) 0.5 % IJ SOLN
INTRAMUSCULAR | Status: AC
  Filled 2014-03-19: qty 30

## 2014-03-19 MED ORDER — KETOROLAC TROMETHAMINE 30 MG/ML IJ SOLN
INTRAMUSCULAR | Status: AC
  Filled 2014-03-19: qty 1

## 2014-03-19 MED ORDER — MIDAZOLAM HCL 2 MG/2ML IJ SOLN
INTRAMUSCULAR | Status: AC
  Filled 2014-03-19: qty 2

## 2014-03-19 MED ORDER — ONDANSETRON HCL 4 MG/2ML IJ SOLN
4.0000 mg | Freq: Four times a day (QID) | INTRAMUSCULAR | Status: DC | PRN
  Administered 2014-03-19: 4 mg via INTRAVENOUS
  Filled 2014-03-19: qty 2

## 2014-03-19 MED ORDER — NEOSTIGMINE METHYLSULFATE 10 MG/10ML IV SOLN
INTRAVENOUS | Status: DC | PRN
  Administered 2014-03-19: 4 mg via INTRAVENOUS

## 2014-03-19 MED ORDER — LACTATED RINGERS IV SOLN
INTRAVENOUS | Status: DC
  Administered 2014-03-19 – 2014-03-20 (×2): via INTRAVENOUS

## 2014-03-19 MED ORDER — DEXAMETHASONE SODIUM PHOSPHATE 4 MG/ML IJ SOLN
4.0000 mg | Freq: Once | INTRAMUSCULAR | Status: AC
  Administered 2014-03-19: 4 mg via INTRAVENOUS

## 2014-03-19 MED ORDER — ROCURONIUM BROMIDE 100 MG/10ML IV SOLN
INTRAVENOUS | Status: DC | PRN
  Administered 2014-03-19: 10 mg via INTRAVENOUS
  Administered 2014-03-19: 30 mg via INTRAVENOUS
  Administered 2014-03-19: 5 mg via INTRAVENOUS

## 2014-03-19 MED ORDER — PROPOFOL 10 MG/ML IV EMUL
INTRAVENOUS | Status: AC
Start: 2014-03-19 — End: 2014-03-19
  Filled 2014-03-19: qty 20

## 2014-03-19 MED ORDER — HEMOSTATIC AGENTS (NO CHARGE) OPTIME
TOPICAL | Status: DC | PRN
  Administered 2014-03-19: 1 via TOPICAL

## 2014-03-19 MED ORDER — LIDOCAINE HCL (CARDIAC) 10 MG/ML IV SOLN
INTRAVENOUS | Status: DC | PRN
  Administered 2014-03-19: 20 mg via INTRAVENOUS

## 2014-03-19 MED ORDER — INFLUENZA VAC SPLIT QUAD 0.5 ML IM SUSY
0.5000 mL | PREFILLED_SYRINGE | INTRAMUSCULAR | Status: AC
  Administered 2014-03-20: 0.5 mL via INTRAMUSCULAR
  Filled 2014-03-19: qty 0.5

## 2014-03-19 MED ORDER — EPHEDRINE SULFATE 50 MG/ML IJ SOLN
INTRAMUSCULAR | Status: DC | PRN
Start: 2014-03-19 — End: 2014-03-19
  Administered 2014-03-19: 5 mg via INTRAVENOUS

## 2014-03-19 MED ORDER — OXYCODONE-ACETAMINOPHEN 5-325 MG PO TABS: 1.0000 | ORAL_TABLET | ORAL | Status: DC | PRN

## 2014-03-19 MED ORDER — LACTATED RINGERS IV SOLN
INTRAVENOUS | Status: DC
  Administered 2014-03-19: 11:00:00 via INTRAVENOUS
  Administered 2014-03-19: 1000 mL via INTRAVENOUS

## 2014-03-19 MED ORDER — ONDANSETRON HCL 4 MG/2ML IJ SOLN
INTRAMUSCULAR | Status: AC
Start: 2014-03-19 — End: 2014-03-19
  Filled 2014-03-19: qty 2

## 2014-03-19 MED ORDER — ONDANSETRON HCL 4 MG/2ML IJ SOLN
4.0000 mg | Freq: Once | INTRAMUSCULAR | Status: AC
  Administered 2014-03-19: 4 mg via INTRAVENOUS

## 2014-03-19 MED ORDER — LIDOCAINE HCL (PF) 1 % IJ SOLN
INTRAMUSCULAR | Status: AC
  Filled 2014-03-19: qty 5

## 2014-03-19 MED ORDER — GLYCOPYRROLATE 0.2 MG/ML IJ SOLN
INTRAMUSCULAR | Status: DC | PRN
  Administered 2014-03-19: 0.6 mg via INTRAVENOUS

## 2014-03-19 MED ORDER — SODIUM CHLORIDE 0.9 % IR SOLN
Status: DC | PRN
  Administered 2014-03-19: 1000 mL

## 2014-03-19 MED ORDER — ROCURONIUM BROMIDE 50 MG/5ML IV SOLN
INTRAVENOUS | Status: AC
  Filled 2014-03-19: qty 2

## 2014-03-19 MED ORDER — FENTANYL CITRATE 0.05 MG/ML IJ SOLN
25.0000 ug | INTRAMUSCULAR | Status: DC | PRN
  Administered 2014-03-19 (×2): 25 ug via INTRAVENOUS

## 2014-03-19 MED ORDER — PROPOFOL 10 MG/ML IV EMUL
INTRAVENOUS | Status: AC
  Filled 2014-03-19: qty 20

## 2014-03-19 MED ORDER — GLYCOPYRROLATE 0.2 MG/ML IJ SOLN
INTRAMUSCULAR | Status: AC
  Filled 2014-03-19: qty 3

## 2014-03-19 MED ORDER — MENTHOL 3 MG MT LOZG
1.0000 | LOZENGE | OROMUCOSAL | Status: DC | PRN
  Filled 2014-03-19: qty 9

## 2014-03-19 MED ORDER — ONDANSETRON HCL 4 MG PO TABS: 4.0000 mg | ORAL_TABLET | Freq: Four times a day (QID) | ORAL | Status: DC | PRN

## 2014-03-19 MED ORDER — FENTANYL CITRATE 0.05 MG/ML IJ SOLN
INTRAMUSCULAR | Status: AC
  Filled 2014-03-19: qty 2

## 2014-03-19 MED ORDER — ONDANSETRON HCL 4 MG/2ML IJ SOLN
INTRAMUSCULAR | Status: AC
  Filled 2014-03-19: qty 2

## 2014-03-19 MED ORDER — DEXAMETHASONE SODIUM PHOSPHATE 4 MG/ML IJ SOLN
INTRAMUSCULAR | Status: AC
  Filled 2014-03-19: qty 1

## 2014-03-19 MED ORDER — MIDAZOLAM HCL 2 MG/2ML IJ SOLN
1.0000 mg | INTRAMUSCULAR | Status: DC | PRN
  Administered 2014-03-19: 2 mg via INTRAVENOUS

## 2014-03-19 MED ORDER — KETOROLAC TROMETHAMINE 30 MG/ML IJ SOLN
30.0000 mg | Freq: Once | INTRAMUSCULAR | Status: AC
  Administered 2014-03-19: 30 mg via INTRAVENOUS

## 2014-03-19 MED ORDER — CHLORHEXIDINE GLUCONATE 4 % EX LIQD: 1.0000 "application " | Freq: Once | CUTANEOUS | Status: DC

## 2014-03-19 MED ORDER — FENTANYL CITRATE 0.05 MG/ML IJ SOLN
INTRAMUSCULAR | Status: DC | PRN
  Administered 2014-03-19 (×3): 50 ug via INTRAVENOUS

## 2014-03-19 MED ORDER — ACETAMINOPHEN 325 MG PO TABS
650.0000 mg | ORAL_TABLET | Freq: Four times a day (QID) | ORAL | Status: DC | PRN
  Administered 2014-03-19 – 2014-03-20 (×2): 650 mg via ORAL
  Filled 2014-03-19 (×2): qty 2

## 2014-03-19 MED ORDER — PROPOFOL 10 MG/ML IV BOLUS
INTRAVENOUS | Status: DC | PRN
  Administered 2014-03-19: 20 mg via INTRAVENOUS
  Administered 2014-03-19: 150 mg via INTRAVENOUS
  Administered 2014-03-19: 30 mg via INTRAVENOUS

## 2014-03-19 SURGICAL SUPPLY — 65 items
ADH SKN CLS APL DERMABOND .7 (GAUZE/BANDAGES/DRESSINGS) ×1
APPLIER CLIP 11 MED OPEN (CLIP)
APPLIER CLIP 9.375 SM OPEN (CLIP) ×3
APR CLP MED 11 20 MLT OPN (CLIP)
APR CLP SM 9.3 20 MLT OPN (CLIP) ×1
ATTRACTOMAT 16X20 MAGNETIC DRP (DRAPES) ×3 IMPLANT
BAG HAMPER (MISCELLANEOUS) ×3 IMPLANT
BLADE SURG 15 STRL LF DISP TIS (BLADE) ×1 IMPLANT
BLADE SURG 15 STRL SS (BLADE) ×3
BLADE SURG SZ10 CARB STEEL (BLADE) ×3 IMPLANT
CHLORAPREP W/TINT 10.5 ML (MISCELLANEOUS) ×3 IMPLANT
CLIP APPLIE 11 MED OPEN (CLIP) ×1 IMPLANT
CLIP APPLIE 9.375 SM OPEN (CLIP) ×1 IMPLANT
CLOTH BEACON ORANGE TIMEOUT ST (SAFETY) ×3 IMPLANT
COVER LIGHT HANDLE STERIS (MISCELLANEOUS) ×6 IMPLANT
DERMABOND ADVANCED (GAUZE/BANDAGES/DRESSINGS) ×2
DERMABOND ADVANCED .7 DNX12 (GAUZE/BANDAGES/DRESSINGS) ×1 IMPLANT
DRAPE PED LAPAROTOMY (DRAPES) ×3 IMPLANT
DRAPE PROXIMA HALF (DRAPES) ×3 IMPLANT
ELECT NDL TIP 2.8 STRL (NEEDLE) ×1 IMPLANT
ELECT NEEDLE TIP 2.8 STRL (NEEDLE) ×3 IMPLANT
ELECT REM PT RETURN 9FT ADLT (ELECTROSURGICAL) ×3
ELECTRODE REM PT RTRN 9FT ADLT (ELECTROSURGICAL) ×1 IMPLANT
FORMALIN 10 PREFIL 120ML (MISCELLANEOUS) ×4 IMPLANT
GAUZE SPONGE 4X4 16PLY XRAY LF (GAUZE/BANDAGES/DRESSINGS) ×6 IMPLANT
GLOVE BIO SURGEON STRL SZ 6.5 (GLOVE) ×2 IMPLANT
GLOVE BIO SURGEONS STRL SZ 6.5 (GLOVE) ×2
GLOVE BIOGEL PI IND STRL 7.0 (GLOVE) IMPLANT
GLOVE BIOGEL PI IND STRL 7.5 (GLOVE) ×1 IMPLANT
GLOVE BIOGEL PI INDICATOR 7.0 (GLOVE) ×4
GLOVE BIOGEL PI INDICATOR 7.5 (GLOVE) ×2
GLOVE ECLIPSE 6.5 STRL STRAW (GLOVE) ×3 IMPLANT
GLOVE SURG SS PI 7.5 STRL IVOR (GLOVE) ×6 IMPLANT
GOWN STRL REUS W/ TWL LRG LVL3 (GOWN DISPOSABLE) ×2 IMPLANT
GOWN STRL REUS W/TWL LRG LVL3 (GOWN DISPOSABLE) ×9 IMPLANT
HEMOSTAT SURGICEL 4X8 (HEMOSTASIS) ×2 IMPLANT
KIT BLADEGUARD II DBL (SET/KITS/TRAYS/PACK) ×3 IMPLANT
KIT ROOM TURNOVER APOR (KITS) ×3 IMPLANT
LIQUID BAND (GAUZE/BANDAGES/DRESSINGS) ×2 IMPLANT
MANIFOLD NEPTUNE II (INSTRUMENTS) ×3 IMPLANT
MARKER SKIN DUAL TIP RULER LAB (MISCELLANEOUS) ×3 IMPLANT
NDL HYPO 25X1 1.5 SAFETY (NEEDLE) ×1 IMPLANT
NEEDLE HYPO 25X1 1.5 SAFETY (NEEDLE) ×3 IMPLANT
NS IRRIG 1000ML POUR BTL (IV SOLUTION) ×3 IMPLANT
PACK BASIC III (CUSTOM PROCEDURE TRAY) ×3
PACK SRG BSC III STRL LF ECLPS (CUSTOM PROCEDURE TRAY) ×1 IMPLANT
PAD ARMBOARD 7.5X6 YLW CONV (MISCELLANEOUS) ×3 IMPLANT
PENCIL HANDSWITCHING (ELECTRODE) ×3 IMPLANT
SET BASIN LINEN APH (SET/KITS/TRAYS/PACK) ×3 IMPLANT
SHEARS HARMONIC 9CM CVD (BLADE) ×2 IMPLANT
SPONGE INTESTINAL PEANUT (DISPOSABLE) ×9 IMPLANT
SUT ETHILON 3 0 FSL (SUTURE) IMPLANT
SUT ETHILON 4 0 PS 2 18 (SUTURE) ×3 IMPLANT
SUT SILK 2 0 (SUTURE) ×3
SUT SILK 2-0 18XBRD TIE 12 (SUTURE) ×1 IMPLANT
SUT SILK 3 0 (SUTURE) ×3
SUT SILK 3-0 18XBRD TIE 12 (SUTURE) IMPLANT
SUT VIC AB 2-0 CT2 27 (SUTURE) ×3 IMPLANT
SUT VIC AB 3-0 SH 27 (SUTURE) ×3
SUT VIC AB 3-0 SH 27X BRD (SUTURE) ×1 IMPLANT
SUT VIC AB 4-0 PS2 27 (SUTURE) ×3 IMPLANT
SYRINGE 10CC LL (SYRINGE) ×3 IMPLANT
SYSTEM CHEST DRAIN TLS 7FR (DRAIN) ×1 IMPLANT
TOWEL OR 17X26 4PK STRL BLUE (TOWEL DISPOSABLE) IMPLANT
YANKAUER SUCT BULB TIP 10FT TU (MISCELLANEOUS) ×3 IMPLANT

## 2014-03-19 NOTE — Progress Notes (Signed)
NURSING PROGRESS NOTE  DENICIA PAGLIARULO [id number] Admitted to 332: 03/19/2014 Attending Provider: Jamesetta So, MD    Colleen Torres is a 58 y.o. female patient admitted from [place] (received report from Santiago Glad, [place]) awake, alert  & orientated  X 4,  Full Code, VSS - Blood pressure 116/62, pulse 57, temperature 97 F (36.1 C), temperature source Axillary, resp. rate 17, height 5\' 10"  (1.778 m), weight 86.637 kg (191 lb), SpO2 97 %. No c/o shortness of breath, no c/o chest pain, no distress noted.    IV site WDL:  Right hand with a transparent dsg that's clean dry and intact.  Allergies:  No Known Allergies   Past Medical History  Diagnosis Date  . Dysrhythmia   . Hypothyroidism   . Depression   . GERD (gastroesophageal reflux disease)   . Arthritis     Bilateral Hands  . Cancer     Follicular neoplasm of thyroid    History:  obtained from patient  Pt orientation to unit, room and routine. Admission INP armband ID verified with patient/family, and in place. SR up x 2, fall risk assessment complete with Patient and family verbalizing understanding of risks associated with falls. Pt verbalizes an understanding of how to use the call bell and to call for help before getting out of bed.  Skin, clean-dry- intact. Surgical incision on neck is clean, dry, and intact at this time. No evidence of skin break down noted on exam.    Will cont to monitor and assist as needed.  Regino Bellow, RN 03/19/2014

## 2014-03-19 NOTE — Plan of Care (Signed)
Problem: Phase I Progression Outcomes Goal: Voiding-avoid urinary catheter unless indicated Outcome: Completed/Met Date Met:  03/19/14     

## 2014-03-19 NOTE — Plan of Care (Signed)
Problem: Consults Goal: General Surgical Patient Education (See Patient Education module for education specifics) Outcome: Completed/Met Date Met:  03/19/14

## 2014-03-19 NOTE — Op Note (Signed)
Patient:  Colleen Torres  DOB:  09/13/1955  MRN:  [id number]   Preop Diagnosis:  Right thyroid neoplasm  Postop Diagnosis:  Same  Procedure:  Right thyroid lobectomy  Surgeon:  Aviva Signs, M.D.  Anes:  Gen. endotracheal  Indications:  Patient is a 58 year old white female, status post left thyroid lobectomy in the past for a multinodular goiter who now presents with a follicular neoplasm in the right lobe of the thyroid gland. The patient now comes in the operating room for a right thyroid lobectomy. The risks and benefits of the procedure including bleeding, infection, nerve injury, and the possibility of finding a malignancy were fully explained to the patient, who gave informed consent.  Procedure note:  The patient is placed the supine position. After induction of general endotracheal anesthesia, the neck was prepped and draped using usual sterile technique with ChloraPrep. Surgical site confirmation was performed.  An incision was made through the previous surgical incision site. Dissection was taken down through the platysma. A prominent superficial jugular vein was found on both sides. No residual left lobe of the thyroid gland 3 found. The right lobe mobilized with a single nodule noted in the upper pole of the right thyroid gland. No lymphadenopathy was noted. The middle thyroidal artery and vein, inferior thyroidal artery and vein, and the suspensory ligament of Alvester Chou were all divided using the harmonic scalpel. Care was taken to avoid the recurrent laryngeal nerve. Both parathyroid glands were identified. The right lobe was then removed in total without difficulty. Was sent to pathology further examination. No abnormal bleeding was noted the end of the procedure. Surgicel was placed in the thyroid bed. The strap muscle was reapproximated along the midline using 2-0 Vicryl interrupted sutures. The platysma was reapproximated using a 3-0 Vicryl running suture. The skin was closed using  a 4 Vicryl subcuticular suture. 0.5% Sensorcaine was instilled the surrounding wound. A liquid Bond was then placed over the incision.  All tape and needle counts were correct at the end of the procedure. Patient was extubated in the operating room and transferred to PACU in stable condition. She was able to phonate the letter E.  Complications:  None  EBL:  Minimal  Specimen:  Right thyroid lobe

## 2014-03-19 NOTE — Anesthesia Procedure Notes (Addendum)
Procedure Name: Intubation Date/Time: 03/19/2014 8:57 AM Performed by: Vista Deck Pre-anesthesia Checklist: Patient identified, Patient being monitored, Timeout performed, Emergency Drugs available and Suction available Patient Re-evaluated:Patient Re-evaluated prior to inductionOxygen Delivery Method: Circle System Utilized Preoxygenation: Pre-oxygenation with 100% oxygen Intubation Type: IV induction Ventilation: Mask ventilation without difficulty Laryngoscope Size: Miller and 2 Grade View: Grade I Tube type: Oral Tube size: 7.0 mm Number of attempts: 1 Airway Equipment and Method: stylet and Oral airway Placement Confirmation: ETT inserted through vocal cords under direct vision,  positive ETCO2 and breath sounds checked- equal and bilateral Secured at: 21 cm Tube secured with: Tape Dental Injury: Teeth and Oropharynx as per pre-operative assessment

## 2014-03-19 NOTE — Anesthesia Preprocedure Evaluation (Signed)
Anesthesia Evaluation  Patient identified by MRN, date of birth, ID band Patient awake    Reviewed: Allergy & Precautions, H&P , NPO status   Airway Mallampati: I  TM Distance: >3 FB     Dental  (+) Teeth Intact   Pulmonary neg pulmonary ROS,  breath sounds clear to auscultation        Cardiovascular + dysrhythmias (s/p ablation ) Supra Ventricular Tachycardia Rhythm:Regular Rate:Normal     Neuro/Psych PSYCHIATRIC DISORDERS Depression    GI/Hepatic GERD-  Medicated and Controlled,  Endo/Other  Hypothyroidism   Renal/GU      Musculoskeletal  (+) Arthritis -,   Abdominal   Peds  Hematology   Anesthesia Other Findings   Reproductive/Obstetrics                             Anesthesia Physical Anesthesia Plan  ASA: III  Anesthesia Plan: General   Post-op Pain Management:    Induction: Intravenous  Airway Management Planned: Oral ETT  Additional Equipment:   Intra-op Plan:   Post-operative Plan: Extubation in OR  Informed Consent: I have reviewed the patients History and Physical, chart, labs and discussed the procedure including the risks, benefits and alternatives for the proposed anesthesia with the patient or authorized representative who has indicated his/her understanding and acceptance.     Plan Discussed with:   Anesthesia Plan Comments:         Anesthesia Quick Evaluation

## 2014-03-19 NOTE — Plan of Care (Signed)
Problem: Phase I Progression Outcomes Goal: Sutures/staples intact Outcome: Completed/Met Date Met:  03/19/14

## 2014-03-19 NOTE — Anesthesia Postprocedure Evaluation (Signed)
  Anesthesia Post-op Note  Patient: Colleen Torres  Procedure(s) Performed: Procedure(s): RIGHT THYROID LOBECTOMY (Right)  Patient Location: PACU  Anesthesia Type:General  Level of Consciousness: awake, alert , oriented and patient cooperative  Airway and Oxygen Therapy: Patient Spontanous Breathing and aerosol face mask  Post-op Pain: mild  Post-op Assessment: Post-op Vital signs reviewed, Patient's Cardiovascular Status Stable, Respiratory Function Stable, Patent Airway, No signs of Nausea or vomiting and Pain level controlled  Post-op Vital Signs: Reviewed and stable  Last Vitals:  Filed Vitals:   03/19/14 1015  BP: 131/75  Pulse: 88  Temp: 36.6 C  Resp: 21    Complications: No apparent anesthesia complications

## 2014-03-19 NOTE — Plan of Care (Signed)
Problem: Consults Goal: Skin Care Protocol Initiated - if Braden Score 18 or less If consults are not indicated, leave blank or document N/A Outcome: Not Applicable Date Met:  03/19/14     

## 2014-03-19 NOTE — Transfer of Care (Signed)
Immediate Anesthesia Transfer of Care Note  Patient: Colleen Torres  Procedure(s) Performed: Procedure(s): RIGHT THYROID LOBECTOMY (Right)  Patient Location: PACU  Anesthesia Type:General  Level of Consciousness: sedated and patient cooperative  Airway & Oxygen Therapy: Patient Spontanous Breathing and aerosol face mask  Post-op Assessment: Report given to PACU RN and Post -op Vital signs reviewed and stable  Post vital signs: Reviewed and stable  Complications: No apparent anesthesia complications

## 2014-03-19 NOTE — Interval H&P Note (Signed)
History and Physical Interval Note:  03/19/2014 8:12 AM  Colleen Torres  has presented today for surgery, with the diagnosis of follicular neoplasm right thyroid  The various methods of treatment have been discussed with the patient and family. After consideration of risks, benefits and other options for treatment, the patient has consented to  Procedure(s): THYROID LOBECTOMY (Right) as a surgical intervention .  The patient's history has been reviewed, patient examined, no change in status, stable for surgery.  I have reviewed the patient's chart and labs.  Questions were answered to the patient's satisfaction.     Aviva Signs A

## 2014-03-20 DIAGNOSIS — E063 Autoimmune thyroiditis: Secondary | ICD-10-CM | POA: Diagnosis not present

## 2014-03-20 LAB — COMPREHENSIVE METABOLIC PANEL
ALBUMIN: 3.2 g/dL — AB (ref 3.5–5.2)
ALT: 17 U/L (ref 0–35)
ANION GAP: 9 (ref 5–15)
AST: 18 U/L (ref 0–37)
Alkaline Phosphatase: 44 U/L (ref 39–117)
BUN: 9 mg/dL (ref 6–23)
CO2: 29 mEq/L (ref 19–32)
CREATININE: 0.79 mg/dL (ref 0.50–1.10)
Calcium: 8.1 mg/dL — ABNORMAL LOW (ref 8.4–10.5)
Chloride: 105 mEq/L (ref 96–112)
GFR calc non Af Amer: >90 mL/min (ref >90)
GLUCOSE: 100 mg/dL — AB (ref 70–99)
Potassium: 4.1 mEq/L (ref 3.7–5.3)
Sodium: 143 mEq/L (ref 137–147)
TOTAL PROTEIN: 5.9 g/dL — AB (ref 6.0–8.3)
Total Bilirubin: 0.3 mg/dL (ref 0.3–1.2)

## 2014-03-20 LAB — CBC
HEMATOCRIT: 36 % (ref 36.0–46.0)
HEMOGLOBIN: 12.4 g/dL (ref 12.0–15.0)
MCH: 31.8 pg (ref 26.0–34.0)
MCHC: 34.4 g/dL (ref 30.0–36.0)
MCV: 92.3 fL (ref 78.0–100.0)
Platelets: 147 10*3/uL — ABNORMAL LOW (ref 150–400)
RBC: 3.9 MIL/uL (ref 3.87–5.11)
RDW: 11.9 % (ref 11.5–15.5)
WBC: 7.3 10*3/uL (ref 4.0–10.5)

## 2014-03-20 MED ORDER — HYDROCODONE-ACETAMINOPHEN 5-325 MG PO TABS: 1.0000 | ORAL_TABLET | ORAL | Status: AC | PRN

## 2014-03-20 MED ORDER — LEVOTHYROXINE SODIUM 125 MCG PO TABS: 125.0000 ug | ORAL_TABLET | Freq: Every day | ORAL | Status: DC

## 2014-03-20 MED ORDER — ACETAMINOPHEN 325 MG PO TABS: 650.0000 mg | ORAL_TABLET | Freq: Four times a day (QID) | ORAL | Status: DC | PRN

## 2014-03-20 NOTE — Addendum Note (Signed)
Addendum  created 03/20/14 1009 by Vista Deck, CRNA   Modules edited: Notes Section   Notes Section:  File: [id number]

## 2014-03-20 NOTE — Discharge Summary (Signed)
Physician Discharge Summary  Patient ID: Colleen Torres MRN: [id number] DOB/AGE: 1955/07/18 58 y.o.  Admit date: 03/19/2014 Discharge date: 03/20/2014  Admission Diagnoses: Right thyroid lobe neoplasm  Discharge Diagnoses: Same Active Problems:   Neoplasm of thyroid   Discharged Condition: good  Hospital Course: Patient is a 59 year old white female status post a left thyroid lobectomy in the past who presented with a right thyroid neoplasm. She underwent a right thyroid lobectomy on 03/19/2014. She tolerated the procedure well. Her postoperative course has been unremarkable. Her diet was advanced without difficulty. Her calcium level was 8.1. She was able to talk without significant difficulty. She is being discharged home in good improving condition.  Treatments: surgery: Right thyroid lobectomy on 03/19/2014  Discharge Exam: Blood pressure 108/67, pulse 77, temperature 99 F (37.2 C), temperature source Oral, resp. rate 17, height 5\' 10"  (1.778 m), weight 86.637 kg (191 lb), SpO2 94 %. General appearance: alert, cooperative and no distress Neck: Incision healing well without ecchymosis or swelling. Resp: clear to auscultation bilaterally Cardio: regular rate and rhythm, S1, S2 normal, no murmur, click, rub or gallop  Disposition:  home     Medication List    TAKE these medications        HYDROcodone-acetaminophen 5-325 MG per tablet  Commonly known as:  NORCO/VICODIN  Take 1 tablet by mouth every 4 (four) hours as needed for moderate pain.     levothyroxine 125 MCG tablet  Commonly known as:  SYNTHROID, LEVOTHROID  Take 1 tablet (125 mcg total) by mouth daily before breakfast.     MOVE FREE PO  Take 1 capsule by mouth daily.     EYE VITAMINS PO  Take 1 capsule by mouth daily.     multivitamins ther. w/minerals Tabs tablet  Take 1 tablet by mouth daily.     nabumetone 500 MG tablet  Commonly known as:  RELAFEN  Take 500 mg by mouth daily.     VITAMIN C PO   Take 1 tablet by mouth daily.      ASK your doctor about these medications        VITAMIN D PO  Take 1 tablet by mouth daily.           Follow-up Information    Follow up with Jamesetta So, MD. Schedule an appointment as soon as possible for a visit on 03/27/2014.   Specialty:  General Surgery   Contact information:   [address] [address] [phone]       Signed: Aviva Signs A 03/20/2014, 8:04 AM

## 2014-03-20 NOTE — Discharge Instructions (Signed)
Thyroidectomy °Care After °Refer to this sheet in the next few weeks. These instructions provide you with general information on caring for yourself after you leave the hospital. Your caregiver also may give you specific instructions. Your treatment has been planned according to the most current medical practices available, but problems sometimes occur. Call your caregiver if you have any problems or questions after your procedure. °HOME CARE INSTRUCTIONS  °· It is normal to be sore for a few weeks following surgery. See your caregiver if your pain seems to be getting worse rather than better. °· Only take over-the-counter or prescription medicines for pain, discomfort, or fever as directed by your caregiver. Avoid taking medicines that contain aspirin and ibuprofen because they increase the risk of bleeding. °· Shower rather than bathe until instructed otherwise by your caregiver. °· Change your bandages (dressings) as directed by your caregiver. °· You may resume a normal diet and activities as directed by your caregiver. °· Avoid lifting weight greater than 20 lb (9 kg) or participating in heavy exercise or contact sports for 10 days or as instructed by your caregiver. °· Make an appointment to see your caregiver for stitch (suture) or staple removal. °SEEK MEDICAL CARE IF:  °· You have increased bleeding from your wound. °· You have redness, swelling, or increasing pain from your wound or in your neck. °· There is pus coming from your wound. °· You have an oral temperature above 102° F (38.9° C). °· There is a bad smell coming from the wound or dressing. °· You develop lightheadedness or feel faint. °· You develop numbness, tingling, or muscle spasms in your arms, hands, feet, or face. °· You have difficulty swallowing. °SEEK IMMEDIATE MEDICAL CARE IF:  °· You develop a rash. °· You have difficulty breathing. °· You hear whistling noises that come from your chest. °· You develop a cough that becomes increasingly  worse. °· You develop any reaction or side effects to medicines given. °· There is swelling in your neck. °· You develop changes in speech or hoarseness, which is getting worse. °MAKE SURE YOU:  °· Understand these instructions. °· Will watch your condition. °· Will get help right away if you are not doing well or get worse. °Document Released: 10/17/2004 Document Revised: 06/22/2011 Document Reviewed: 06/06/2010 °[employer]® Patient Information ©2015 [employer]. This information is not intended to replace advice given to you by your health care provider. Make sure you discuss any questions you have with your health care provider. ° °

## 2014-03-20 NOTE — Anesthesia Postprocedure Evaluation (Signed)
  Anesthesia Post-op Note  Patient: Colleen Torres  Procedure(s) Performed: Procedure(s): RIGHT THYROID LOBECTOMY (Right)  Patient Location: Nursing Unit  Anesthesia Type:General  Level of Consciousness: awake, alert  and patient cooperative  Airway and Oxygen Therapy: Patient Spontanous Breathing  Post-op Pain: mild  Post-op Assessment: Post-op Vital signs reviewed, Patient's Cardiovascular Status Stable, Respiratory Function Stable, Patent Airway and No signs of Nausea or vomiting  Post-op Vital Signs: Reviewed and stable  Last Vitals:  Filed Vitals:   03/20/14 0500  BP: 108/67  Pulse: 77  Temp: 37.2 C  Resp: 17    Complications: No apparent anesthesia complications

## 2014-03-20 NOTE — Progress Notes (Signed)
Patient with orders to be discharge home. Discharge instructions given, patient verbalized understanding. Prescriptions given. Patient stable. Patient left with family in private vehicle.  

## 2014-03-21 ENCOUNTER — Encounter (HOSPITAL_COMMUNITY): Payer: Self-pay | Admitting: General Surgery

## 2014-05-10 ENCOUNTER — Ambulatory Visit (INDEPENDENT_AMBULATORY_CARE_PROVIDER_SITE_OTHER): Payer: PRIVATE HEALTH INSURANCE | Admitting: Otolaryngology

## 2014-05-10 DIAGNOSIS — R490 Dysphonia: Secondary | ICD-10-CM

## 2014-05-10 DIAGNOSIS — R1312 Dysphagia, oropharyngeal phase: Secondary | ICD-10-CM

## 2015-05-29 ENCOUNTER — Encounter: Payer: Self-pay | Admitting: "Endocrinology

## 2015-05-29 ENCOUNTER — Ambulatory Visit (INDEPENDENT_AMBULATORY_CARE_PROVIDER_SITE_OTHER): Payer: PRIVATE HEALTH INSURANCE | Admitting: "Endocrinology

## 2015-05-29 VITALS — BP 120/83 | HR 78 | Ht 69.5 in | Wt 198.0 lb

## 2015-05-29 DIAGNOSIS — E890 Postprocedural hypothyroidism: Secondary | ICD-10-CM

## 2015-05-29 MED ORDER — LEVOTHYROXINE SODIUM 150 MCG PO TABS: 150.0000 ug | ORAL_TABLET | Freq: Every day | ORAL | Status: DC

## 2015-05-29 NOTE — Progress Notes (Signed)
Subjective:    Patient ID: Colleen Torres, female    DOB: 02/26/1956, PCP PROVIDER NOT IN SYSTEM   Past Medical History  Diagnosis Date  . Dysrhythmia   . Hypothyroidism   . Depression   . GERD (gastroesophageal reflux disease)   . Arthritis     Bilateral Hands  . Cancer ([place])     Follicular neoplasm of thyroid   Past Surgical History  Procedure Laterality Date  . Supraventricular tachycardia ablation  09/2002  . Appendectomy    . Thyroidectomy, partial Left 2011  . Cyst removal neck    . Tubal ligation    . Thyroidectomy Right 03/19/2014    Procedure: RIGHT THYROID LOBECTOMY;  Surgeon: Jamesetta So, MD;  Location: [facility];  Service: General;  Laterality: Right;   Social History   Social History  . Marital Status: Married    Spouse Name: N/A  . Number of Children: N/A  . Years of Education: N/A   Social History Main Topics  . Smoking status: Never Smoker   . Smokeless tobacco: Never Used  . Alcohol Use: No  . Drug Use: No  . Sexual Activity: Not Asked   Other Topics Concern  . None   Social History Narrative   Outpatient Encounter Prescriptions as of 05/29/2015  Medication Sig  . Ascorbic Acid (VITAMIN C PO) Take 1 tablet by mouth daily.  . Cholecalciferol (VITAMIN D PO) Take 1 tablet by mouth daily.  . Glucosamine-Chondroitin (MOVE FREE PO) Take 1 capsule by mouth daily.  Marland Kitchen levothyroxine (SYNTHROID, LEVOTHROID) 150 MCG tablet Take 1 tablet (150 mcg total) by mouth daily before breakfast.  . Multiple Vitamins-Minerals (EYE VITAMINS PO) Take 1 capsule by mouth daily.  . Multiple Vitamins-Minerals (MULTIVITAMINS THER. W/MINERALS) TABS tablet Take 1 tablet by mouth daily.  . nabumetone (RELAFEN) 500 MG tablet Take 500 mg by mouth daily.  . [DISCONTINUED] levothyroxine (SYNTHROID, LEVOTHROID) 137 MCG tablet Take 137 mcg by mouth daily before breakfast.  . [DISCONTINUED] levothyroxine (SYNTHROID, LEVOTHROID) 125 MCG tablet Take 1 tablet (125 mcg total) by mouth  daily before breakfast.   No facility-administered encounter medications on file as of 05/29/2015.   ALLERGIES: No Known Allergies VACCINATION STATUS: Immunization History  Administered Date(s) Administered  . Influenza,inj,Quad PF,36+ Mos 03/20/2014    HPI  60 yr old female with medical hx as above. She is here  to follow-up for postsurgical hypothyroidism . She is on levothyroxine 137 g by mouth every morning. She is also on calcium supplement for transient hypocalcemia following her most recent surgery. She has had history of multinodular goiter , on follow-up ultrasound and the FNA which showed FLUS, and underwent completion thyroidectomy with benign outcomes.  She underwent her first  left hemithyroidectomy in 2000 for MNG, reportedly benign.  she c/o stress, fatigue. she denies dysphagia, SOB.   Review of Systems Constitutional: no weight gain/loss, no fatigue, no subjective hyperthermia/hypothermia Eyes: no blurry vision, no xerophthalmia ENT: no sore throat, no nodules palpated in throat, no dysphagia/odynophagia, no hoarseness Cardiovascular: no CP/SOB/palpitations/leg swelling Respiratory: no cough/SOB Gastrointestinal: no N/V/D/C Musculoskeletal: no muscle/joint aches Skin: no rashes Neurological: no tremors/numbness/tingling/dizziness Psychiatric: no depression/anxiety  Objective:    BP 120/83 mmHg  Pulse 78  Ht 5' 9.5" (1.765 m)  Wt 198 lb (89.812 kg)  BMI 28.83 kg/m2  SpO2 98%  Wt Readings from Last 3 Encounters:  05/29/15 198 lb (89.812 kg)  03/19/14 191 lb (86.637 kg)  03/14/14 191 lb (86.637 kg)  Physical Exam Constitutional: overweight, in NAD Eyes: PERRLA, EOMI, no exophthalmos ENT: moist mucous membranes, post  Thyroidectomy scar. no cervical lymphadenopathy Cardiovascular: RRR, No MRG Respiratory: CTA B Gastrointestinal: abdomen soft, NT, ND, BS+ Musculoskeletal: no deformities, strength intact in all 4 Skin: moist, warm, no  rashes Neurological: no tremor with outstretched hands, DTR normal in all 4  Her labs from 05/21/2015 showed PTH 25, calcium 8.9 TSH 3.05, free T4 1 0.4 Vitamin D 35  - Complete labs to be scanned into her records.  Assessment & Plan:   1. Postsurgical hypothyroidism She has history of multinodular goiter. She underwent remnant thyroidectomy on 03/19/14, no malignancy. Her labs show she is under-replaced with LT4.  I will increase synthroid to 150 mcg po qam for now.   2. Hypocalcemia -Her calcium has improved to 8.9 along with normal PTH of 25 and normal vitamin D of 37. -I suggested for her to lower calcium supplement to once a day.   - I advised patient to maintain close follow up with PROVIDER NOT [place] for primary care needs. Follow up plan: Return in about 6 months (around 11/26/2015) for underactive thyroid, follow up with pre-visit labs.  Glade Lloyd, MD Phone: [phone]  Fax: [phone]   05/29/2015, 11:25 AM

## 2015-06-05 ENCOUNTER — Encounter: Payer: Self-pay | Admitting: "Endocrinology

## 2015-09-18 ENCOUNTER — Ambulatory Visit (INDEPENDENT_AMBULATORY_CARE_PROVIDER_SITE_OTHER): Payer: PRIVATE HEALTH INSURANCE

## 2015-09-18 ENCOUNTER — Ambulatory Visit (INDEPENDENT_AMBULATORY_CARE_PROVIDER_SITE_OTHER): Payer: PRIVATE HEALTH INSURANCE | Admitting: Orthopaedic Surgery

## 2015-09-18 ENCOUNTER — Encounter: Payer: Self-pay | Admitting: Orthopaedic Surgery

## 2015-09-18 VITALS — BP 128/74 | HR 73 | Temp 97.0°F | Ht 68.5 in | Wt 198.0 lb

## 2015-09-18 DIAGNOSIS — M542 Cervicalgia: Secondary | ICD-10-CM

## 2015-09-18 DIAGNOSIS — M545 Low back pain, unspecified: Secondary | ICD-10-CM

## 2015-09-18 MED ORDER — NAPROXEN 500 MG PO TABS: 500.0000 mg | ORAL_TABLET | Freq: Two times a day (BID) | ORAL | Status: DC

## 2015-09-18 NOTE — Patient Instructions (Signed)
Stop Relafen.  Begin Naprosyn one twice a day after eating.

## 2015-09-18 NOTE — Progress Notes (Signed)
Subjective:  My neck hurts    Patient ID: Colleen Torres, female    DOB: 1956-02-17, 60 y.o.   MRN: [id number]  HPI She has had neck pain for many years.  It was on the left side of her neck and she had limited motion to the left.  She has put up with this for a long time.  She had no trauma, no paresthesias.  She just accepted the limitation of motion.  Over the last month she has developed pain in the right side of the posterior neck and now limited motion in turning her head to the right.  She has problems backing up her car now.  She has problems sleeping with pain in the neck.  She has no paresthesias, no trauma, no untoward incident.   She helped with a family reunion recently and over 44 people were in attendance.    She has tried ibuprofen with slight help.  She has not used heat or ice.  She is getting worse and is here today to find out why.  She also has lower back pain with more pain on the right side of the lower back.  This has been long standing.  She has no paresthesias.  She says the pain comes and goes.  She does stretching exercises which help.  She has many horses and takes care of them daily.  Review of Systems  HENT: Negative for congestion.   Respiratory: Negative for cough and shortness of breath.   Cardiovascular: Negative for chest pain and leg swelling.  Endocrine: Negative for cold intolerance.  Musculoskeletal: Positive for back pain and arthralgias.  Allergic/Immunologic: Negative for environmental allergies.   Past Medical History  Diagnosis Date  . Dysrhythmia   . Hypothyroidism   . Depression   . GERD (gastroesophageal reflux disease)   . Arthritis     Bilateral Hands  . Cancer ([place])     Follicular neoplasm of thyroid    Past Surgical History  Procedure Laterality Date  . Supraventricular tachycardia ablation  09/2002  . Appendectomy    . Thyroidectomy, partial Left 2011  . Cyst removal neck    . Tubal ligation    . Thyroidectomy Right  03/19/2014    Procedure: RIGHT THYROID LOBECTOMY;  Surgeon: Jamesetta So, MD;  Location: [facility];  Service: General;  Laterality: Right;    Current Outpatient Prescriptions on File Prior to Visit  Medication Sig Dispense Refill  . Ascorbic Acid (VITAMIN C PO) Take 1 tablet by mouth daily.    . Cholecalciferol (VITAMIN D PO) Take 1 tablet by mouth daily.    . Glucosamine-Chondroitin (MOVE FREE PO) Take 1 capsule by mouth daily.    Marland Kitchen levothyroxine (SYNTHROID, LEVOTHROID) 150 MCG tablet Take 1 tablet (150 mcg total) by mouth daily before breakfast. 30 tablet 6  . Multiple Vitamins-Minerals (EYE VITAMINS PO) Take 1 capsule by mouth daily.    . Multiple Vitamins-Minerals (MULTIVITAMINS THER. W/MINERALS) TABS tablet Take 1 tablet by mouth daily.    . nabumetone (RELAFEN) 500 MG tablet Take 500 mg by mouth daily.     No current facility-administered medications on file prior to visit.    Social History   Social History  . Marital Status: Married    Spouse Name: N/A  . Number of Children: N/A  . Years of Education: N/A   Occupational History  . Not on file.   Social History Main Topics  . Smoking status: Never Smoker   .  Smokeless tobacco: Never Used  . Alcohol Use: No  . Drug Use: No  . Sexual Activity: Not on file   Other Topics Concern  . Not on file   Social History Narrative    BP 128/74 mmHg  Pulse 73  Temp(Src) 97 F (36.1 C)  Ht 5' 8.5" (1.74 m)  Wt 198 lb (89.812 kg)  BMI 29.66 kg/m2     Objective:   Physical Exam  Constitutional: She is oriented to person, place, and time. She appears well-developed and well-nourished.  HENT:  Head: Normocephalic and atraumatic.  Eyes: Conjunctivae and EOM are normal. Pupils are equal, round, and reactive to light.  Neck: Normal range of motion. Neck supple.  Cardiovascular: Normal rate, regular rhythm and intact distal pulses.   Pulmonary/Chest: Effort normal.  Abdominal: Soft.  Musculoskeletal: She exhibits tenderness  (She has tenderness of the right nuchal area and pain in turning her head past 10 degrees to the right.  She has limited motion to the left of 10 degrees also.  Flexion, extension is normal.  NV intact.).  Neurological: She is alert and oriented to person, place, and time. She displays normal reflexes. No cranial nerve deficit. She exhibits normal muscle tone. Coordination normal.  Skin: Skin is warm and dry.  Psychiatric: She has a normal mood and affect. Her behavior is normal. Judgment and thought content normal.  Back Exam   Tenderness  The patient is experiencing tenderness in the lumbar.  Range of Motion  The patient has normal back ROM. Lateral Bend Right: normal  Lateral Bend Left: normal  Rotation Right: normal  Rotation Left: normal   Muscle Strength  The patient has normal back strength.  Tests  Straight leg raise right: negative Straight leg raise left: negative  Reflexes  Patellar: normal Achilles: normal Biceps: normal  Other  Toe Walk: normal Heel Walk: normal Sensation: normal Gait: normal  Erythema: no back redness Scars: absent  Comments:  She is tender on the right side of the lower back around L3 and L4.  No spasm is present.  ROM is full and gait normal.  She can touch her toes easily.      X-rays of the cervical spine were done, reported separately.      Assessment & Plan:   Encounter Diagnoses  Name Primary?  . Neck pain Yes  . Cervicalgia   . Right-sided low back pain without sciatica    Begin Naprosyn.  Precautions given.  Return in two weeks.  She may need MRI or PT.  Call if any problem.  Electronically Signed Sanjuana Kava, MD 6/7/20179:12 AM

## 2015-09-26 ENCOUNTER — Ambulatory Visit (INDEPENDENT_AMBULATORY_CARE_PROVIDER_SITE_OTHER): Payer: PRIVATE HEALTH INSURANCE | Admitting: Adult Health

## 2015-09-26 ENCOUNTER — Encounter: Payer: Self-pay | Admitting: Adult Health

## 2015-09-26 VITALS — BP 110/80 | HR 74 | Ht 69.0 in | Wt 201.0 lb

## 2015-09-26 DIAGNOSIS — R1031 Right lower quadrant pain: Secondary | ICD-10-CM | POA: Diagnosis not present

## 2015-09-26 HISTORY — DX: Right lower quadrant pain: R10.31

## 2015-09-26 LAB — POCT URINALYSIS DIPSTICK
Glucose, UA: NEGATIVE
Leukocytes, UA: NEGATIVE
NITRITE UA: NEGATIVE
Protein, UA: NEGATIVE
RBC UA: NEGATIVE

## 2015-09-26 NOTE — Progress Notes (Signed)
Subjective:     Patient ID: Stanton Kidney, female   DOB: Jan 13, 1956, 60 y.o.   MRN: [id number]  HPI Leea is a 60 year old white female, in complaining of having had dull ache in RLQ that radiated to back last week, but since taking naproxen for neck it has gone away, she had not urinary symptoms or GI upset.   Review of Systems  Patient denies any headaches, hearing loss, fatigue, blurred vision, shortness of breath, chest pain,  problems with bowel movements, urination, or intercourse. No joint pain or mood swings.See HPI for positives. Reviewed past medical,surgical, social and family history. Reviewed medications and allergies.     Objective:   Physical Exam BP 110/80 mmHg  Pulse 74  Ht 5\' 9"  (1.753 m)  Wt 201 lb (91.173 kg)  BMI 29.67 kg/m2    Urine dipstick negative, Skin warm and dry.Pelvic: external genitalia is normal in appearance no lesions, vagina: scant discharge without odor,urethra has no lesions or masses noted, cervix:smooth, uterus: normal size, shape and contour, non tender, no masses felt, adnexa: no masses or tenderness noted. Bladder is non tender and no masses felt. No CVAT, abdomen is soft and non tender.  Assessment:     RLQ pain, resolved    Plan:       Return in 2 weeks for pap and physical  Call before if pain returns, will get [place]

## 2015-09-26 NOTE — Patient Instructions (Signed)
Return in 2 weeks for pap and physical

## 2015-10-02 ENCOUNTER — Ambulatory Visit (INDEPENDENT_AMBULATORY_CARE_PROVIDER_SITE_OTHER): Payer: PRIVATE HEALTH INSURANCE | Admitting: Orthopaedic Surgery

## 2015-10-02 ENCOUNTER — Encounter: Payer: Self-pay | Admitting: Orthopaedic Surgery

## 2015-10-02 VITALS — BP 116/78 | HR 70 | Ht 69.0 in | Wt 199.0 lb

## 2015-10-02 DIAGNOSIS — M542 Cervicalgia: Secondary | ICD-10-CM | POA: Diagnosis not present

## 2015-10-02 MED ORDER — DICLOFENAC SODIUM 75 MG PO TBEC: 75.0000 mg | DELAYED_RELEASE_TABLET | Freq: Two times a day (BID) | ORAL | Status: DC

## 2015-10-02 NOTE — Progress Notes (Signed)
Patient [id number] Colleen Torres, female DOB:January 23, 1956, 60 y.o. [id number]  Chief Complaint  Patient presents with  . Follow-up    Neck pain    HPI  Colleen Torres is a 60 y.o. female who is seen in follow-up for neck pain.  She has been on naprosyn.  Her neck pain is significantly improved.  She has better motion and less pain.  However, she has swelling of the ankles. I will stop the Naprosyn and begin diclofenac 75.  I will not order MRI as she is improved.  HPI  Body mass index is 29.37 kg/(m^2).  ROS  Review of Systems  HENT: Negative for congestion.   Respiratory: Negative for cough and shortness of breath.   Cardiovascular: Negative for chest pain and leg swelling.  Endocrine: Negative for cold intolerance.  Musculoskeletal: Positive for back pain and arthralgias.  Allergic/Immunologic: Negative for environmental allergies.    Past Medical History  Diagnosis Date  . Dysrhythmia   . Hypothyroidism   . Depression   . GERD (gastroesophageal reflux disease)   . Arthritis     Bilateral Hands  . Cancer ([facility])     Follicular neoplasm of thyroid  . Abdominal pain, RLQ 09/26/2015    Past Surgical History  Procedure Laterality Date  . Supraventricular tachycardia ablation  09/2002  . Appendectomy    . Thyroidectomy, partial Left 2011  . Cyst removal neck    . Tubal ligation    . Thyroidectomy Right 03/19/2014    Procedure: RIGHT THYROID LOBECTOMY;  Surgeon: Jamesetta So, MD;  Location: [facility];  Service: General;  Laterality: Right;    Family History  Problem Relation Age of Onset  . Depression Father   . Anxiety disorder Father   . Other Mother     hip replacement; kidney stone  . Depression Brother   . Anxiety disorder Brother   . Miscarriages / [place] Daughter   . Macular degeneration Maternal Grandmother   . Heart disease Maternal Grandmother     Social History Social History  Substance Use Topics  . Smoking status: Never Smoker   . Smokeless  tobacco: Never Used  . Alcohol Use: No    No Known Allergies  Current Outpatient Prescriptions  Medication Sig Dispense Refill  . Ascorbic Acid (VITAMIN C PO) Take 1 tablet by mouth daily.    . Cholecalciferol (VITAMIN D PO) Take 1 tablet by mouth daily.    . Glucosamine-Chondroitin (MOVE FREE PO) Take 1 capsule by mouth daily.    Marland Kitchen levothyroxine (SYNTHROID, LEVOTHROID) 150 MCG tablet Take 1 tablet (150 mcg total) by mouth daily before breakfast. 30 tablet 6  . Multiple Vitamins-Minerals (EYE VITAMINS PO) Take 1 capsule by mouth daily.    . Multiple Vitamins-Minerals (MULTIVITAMINS THER. W/MINERALS) TABS tablet Take 1 tablet by mouth 2 (two) times daily.     . diclofenac (VOLTAREN) 75 MG EC tablet Take 1 tablet (75 mg total) by mouth 2 (two) times daily with a meal. 60 tablet 2   No current facility-administered medications for this visit.     Physical Exam  Blood pressure 116/78, pulse 70, height 5\' 9"  (1.753 m), weight 199 lb (90.266 kg).  Constitutional: overall normal hygiene, normal nutrition, well developed, normal grooming, normal body habitus. Assistive device:none  Musculoskeletal: gait and station Limp none, muscle tone and strength are normal, no tremors or atrophy is present.  .  Neurological: coordination overall normal.  Deep tendon reflex/nerve stretch intact.  Sensation normal.  Cranial nerves  II-XII intact.   Skin:   normal overall no scars, lesions, ulcers or rashes. No psoriasis.  Psychiatric: Alert and oriented x 3.  Recent memory intact, remote memory unclear.  Normal mood and affect. Well groomed.  Good eye contact.  Cardiovascular: overall no swelling, no varicosities, no edema bilaterally, normal temperatures of the legs and arms, no clubbing, cyanosis and good capillary refill.  Lymphatic: palpation is normal.  She has slight tenderness in moving her neck to the right but no pain or paresthesias.  Grips are normal.  Reflexes normal.  She is improved.   She has no spasm   The patient has been educated about the nature of the problem(s) and counseled on treatment options.  The patient appeared to understand what I have discussed and is in agreement with it.  Encounter Diagnoses  Name Primary?  . Cervicalgia   . Neck pain Yes    PLAN Call if any problems.  Precautions discussed.  Continue current medications.   Return to clinic 1 month   Change to diclofenac from naprosyn.

## 2015-10-10 ENCOUNTER — Other Ambulatory Visit: Payer: PRIVATE HEALTH INSURANCE | Admitting: Adult Health

## 2015-10-17 ENCOUNTER — Ambulatory Visit (INDEPENDENT_AMBULATORY_CARE_PROVIDER_SITE_OTHER): Payer: PRIVATE HEALTH INSURANCE | Admitting: Adult Health

## 2015-10-17 ENCOUNTER — Other Ambulatory Visit (HOSPITAL_COMMUNITY)
Admission: RE | Admit: 2015-10-17 | Discharge: 2015-10-17 | Disposition: A | Discharge status: Home / Self Care | Payer: PRIVATE HEALTH INSURANCE | Source: Ambulatory Visit | Attending: Adult Health | Admitting: Adult Health

## 2015-10-17 ENCOUNTER — Encounter: Payer: Self-pay | Admitting: Adult Health

## 2015-10-17 VITALS — BP 130/76 | HR 74 | Ht 69.0 in | Wt 198.0 lb

## 2015-10-17 DIAGNOSIS — Z1211 Encounter for screening for malignant neoplasm of colon: Secondary | ICD-10-CM

## 2015-10-17 DIAGNOSIS — Z1151 Encounter for screening for human papillomavirus (HPV): Secondary | ICD-10-CM | POA: Diagnosis not present

## 2015-10-17 DIAGNOSIS — Z01419 Encounter for gynecological examination (general) (routine) without abnormal findings: Secondary | ICD-10-CM

## 2015-10-17 DIAGNOSIS — Z01411 Encounter for gynecological examination (general) (routine) with abnormal findings: Secondary | ICD-10-CM | POA: Diagnosis present

## 2015-10-17 LAB — HEMOCCULT GUIAC POC 1CARD (OFFICE): Fecal Occult Blood, POC: NEGATIVE

## 2015-10-17 NOTE — Progress Notes (Signed)
Patient ID: Colleen Torres, female   DOB: 09/07/1955, 60 y.o.   MRN: [id number] History of Present Illness: Colleen Torres is a 60 year old white female in for a well woman gyn exam and pap. PCP is [facility] and she sees Dr Dorris Fetch for her thyroid.   Current Medications, Allergies, Past Medical History, Past Surgical History, Family History and Social History were reviewed in [employer] record.     Review of Systems: Patient denies any headaches, hearing loss, fatigue, blurred vision, shortness of breath, chest pain, abdominal pain, problems with bowel movements, urination, or intercourse. No joint pain or mood swings.She has neck pain and is seeing Dr Luna Glasgow.    Physical Exam:BP 130/76 mmHg  Pulse 74  Ht 5\' 9"  (1.753 m)  Wt 198 lb (89.812 kg)  BMI 29.23 kg/m2 General:  Well developed, well nourished, no acute distress Skin:  Warm and dry Neck:  Midline trachea,  Thyroid absent, good ROM, no lymphadenopathy, no carotid bruits heard Lungs; Clear to auscultation bilaterally Breast:  No dominant palpable mass, retraction, or nipple discharge Cardiovascular: Regular rate and rhythm Abdomen:  Soft, non tender, no hepatosplenomegaly Pelvic:  External genitalia is normal in appearance, no lesions.  The vagina is pale with loss of rugae and moisture. Urethra has no lesions or masses. The cervix is smooth and has a stenotic os, pap with HPV performed.  Uterus is felt to be normal size, shape, and contour.  No adnexal masses or tenderness noted.Bladder is non tender, no masses felt. Rectal: Good sphincter tone, no polyps, or hemorrhoids felt.  Hemoccult negative. Extremities/musculoskeletal:  No swelling or varicosities noted, no clubbing or cyanosis Psych:  No mood changes, alert and cooperative,seems happy   Impression: Well woman gyn exam and pap    Plan: Physical in 1 year, pap in 3 if normal Get mammogram now and yearly Labs with PCP

## 2015-10-17 NOTE — Patient Instructions (Signed)
Physical in 1year, pap in 3 if normal Mammogram now and yearly Labs with PCP

## 2015-10-21 LAB — CYTOLOGY - PAP

## 2015-10-30 ENCOUNTER — Encounter: Payer: Self-pay | Admitting: Orthopaedic Surgery

## 2015-10-30 ENCOUNTER — Ambulatory Visit (INDEPENDENT_AMBULATORY_CARE_PROVIDER_SITE_OTHER): Payer: PRIVATE HEALTH INSURANCE | Admitting: Orthopaedic Surgery

## 2015-10-30 VITALS — BP 119/78 | HR 73 | Temp 97.7°F | Ht 69.0 in | Wt 196.2 lb

## 2015-10-30 DIAGNOSIS — M542 Cervicalgia: Secondary | ICD-10-CM

## 2015-10-30 NOTE — Progress Notes (Signed)
Patient [name] Colleen Torres, female DOB:[birth year], 60 y.o. [id number]  Chief Complaint  Patient presents with  . Follow-up    Neck pain    HPI  Colleen Torres is a 60 y.o. female who has neck pain.  Her neck pain is much improved.  She has chronic pain and chronic decreased motion.  She is back to her status quo with no pain now and still limited motion.  She has no paresthesias or new trauma.  She is doing her exercises daily.  HPI  Body mass index is 28.96 kg/(m^2).  ROS  Review of Systems  HENT: Negative for congestion.   Respiratory: Negative for cough and shortness of breath.   Cardiovascular: Negative for chest pain and leg swelling.  Endocrine: Negative for cold intolerance.  Musculoskeletal: Positive for back pain and arthralgias.  Allergic/Immunologic: Negative for environmental allergies.    Past Medical History  Diagnosis Date  . Dysrhythmia   . Hypothyroidism   . Depression   . GERD (gastroesophageal reflux disease)   . Arthritis     Bilateral Hands  . Cancer ([facility])     Follicular neoplasm of thyroid  . Abdominal pain, RLQ 09/26/2015    Past Surgical History  Procedure Laterality Date  . Supraventricular tachycardia ablation  09/2002  . Appendectomy    . Thyroidectomy, partial Left 2011  . Cyst removal neck    . Tubal ligation    . Thyroidectomy Right 03/19/2014    Procedure: RIGHT THYROID LOBECTOMY;  Surgeon: Jamesetta So, MD;  Location: [facility];  Service: General;  Laterality: Right;    Family History  Problem Relation Age of Onset  . Depression Father   . Anxiety disorder Father   . Other Mother     hip replacement; kidney stone  . Depression Brother   . Anxiety disorder Brother   . Miscarriages / [place] Daughter   . Macular degeneration Maternal Grandmother   . Heart disease Maternal Grandmother     Social History Social History  Substance Use Topics  . Smoking status: Never Smoker   . Smokeless tobacco: Never Used  . Alcohol  Use: No    No Known Allergies  Current Outpatient Prescriptions  Medication Sig Dispense Refill  . Ascorbic Acid (VITAMIN C PO) Take 1 tablet by mouth daily.    . B Complex-C (SUPER B COMPLEX PO) Take by mouth daily.    Marland Kitchen CALCIUM PO Take 1,250 mg by mouth daily.    . Cholecalciferol (VITAMIN D PO) Take 1 tablet by mouth daily.    . diclofenac (VOLTAREN) 75 MG EC tablet Take 1 tablet (75 mg total) by mouth 2 (two) times daily with a meal. 60 tablet 2  . Glucosamine-Chondroitin (MOVE FREE PO) Take 1 capsule by mouth daily.    Marland Kitchen levothyroxine (SYNTHROID, LEVOTHROID) 150 MCG tablet Take 1 tablet (150 mcg total) by mouth daily before breakfast. 30 tablet 6  . Multiple Vitamins-Minerals (EYE VITAMINS PO) Take 2 capsules by mouth daily.     . Multiple Vitamins-Minerals (MULTIVITAMINS THER. W/MINERALS) TABS tablet Take 1 tablet by mouth daily.      No current facility-administered medications for this visit.     Physical Exam  Blood pressure 119/78, pulse 73, temperature 97.7 F (36.5 C), height 5\' 9"  (1.753 m), weight 196 lb 3.2 oz (88.996 kg).  Constitutional: overall normal hygiene, normal nutrition, well developed, normal grooming, normal body habitus. Assistive device:none  Musculoskeletal: gait and station Limp none, muscle tone and strength are  normal, no tremors or atrophy is present.  .  Neurological: coordination overall normal.  Deep tendon reflex/nerve stretch intact.  Sensation normal.  Cranial nerves II-XII intact.   Skin:   normal overall no scars, lesions, ulcers or rashes. No psoriasis.  Psychiatric: Alert and oriented x 3.  Recent memory intact, remote memory unclear.  Normal mood and affect. Well groomed.  Good eye contact.  Cardiovascular: overall no swelling, no varicosities, no edema bilaterally, normal temperatures of the legs and arms, no clubbing, cyanosis and good capillary refill.  Lymphatic: palpation is normal.  Her neck has decreased motion of 15 degrees  to either side and full extension and flexion.  NV intact.  The patient has been educated about the nature of the problem(s) and counseled on treatment options.  The patient appeared to understand what I have discussed and is in agreement with it.  Encounter Diagnoses  Name Primary?  . Cervicalgia Yes  . Neck pain     PLAN Call if any problems.  Precautions discussed.  Continue current medications.   Return to clinic PRN   Electronically Signed Sanjuana Kava, MD 7/19/20178:29 AM

## 2015-11-12 ENCOUNTER — Other Ambulatory Visit: Payer: Self-pay | Admitting: "Endocrinology

## 2015-11-13 ENCOUNTER — Other Ambulatory Visit: Payer: Self-pay | Admitting: *Deleted

## 2015-12-05 ENCOUNTER — Other Ambulatory Visit: Payer: Self-pay | Admitting: "Endocrinology

## 2015-12-05 DIAGNOSIS — E890 Postprocedural hypothyroidism: Secondary | ICD-10-CM

## 2015-12-05 LAB — T4, FREE: FREE T4: 1.5 ng/dL (ref 0.8–1.8)

## 2015-12-06 LAB — TSH: TSH: 0.05 mIU/L — ABNORMAL LOW

## 2015-12-11 ENCOUNTER — Ambulatory Visit: Payer: PRIVATE HEALTH INSURANCE | Admitting: "Endocrinology

## 2015-12-17 ENCOUNTER — Encounter: Payer: Self-pay | Admitting: "Endocrinology

## 2015-12-17 ENCOUNTER — Ambulatory Visit (INDEPENDENT_AMBULATORY_CARE_PROVIDER_SITE_OTHER): Payer: PRIVATE HEALTH INSURANCE | Admitting: "Endocrinology

## 2015-12-17 VITALS — BP 129/82 | HR 88 | Ht 69.0 in | Wt 198.0 lb

## 2015-12-17 DIAGNOSIS — E890 Postprocedural hypothyroidism: Secondary | ICD-10-CM | POA: Diagnosis not present

## 2015-12-17 MED ORDER — LEVOTHYROXINE SODIUM 150 MCG PO TABS: 150.0000 ug | ORAL_TABLET | Freq: Every day | ORAL | 6 refills | Status: DC

## 2015-12-17 NOTE — Progress Notes (Signed)
Subjective:    Patient ID: Colleen Torres, female    DOB: 09/13/1955, PCP PROVIDER NOT IN SYSTEM   Past Medical History:  Diagnosis Date  . Abdominal pain, RLQ 09/26/2015  . Arthritis    Bilateral Hands  . Cancer (HCC)    Follicular neoplasm of thyroid  . Depression   . Dysrhythmia   . GERD (gastroesophageal reflux disease)   . Hypothyroidism    Past Surgical History:  Procedure Laterality Date  . APPENDECTOMY    . CYST REMOVAL NECK    . SUPRAVENTRICULAR TACHYCARDIA ABLATION  09/2002  . THYROIDECTOMY Right 03/19/2014   Procedure: RIGHT THYROID LOBECTOMY;  Surgeon: Jamesetta So, MD;  Location: [facility];  Service: General;  Laterality: Right;  . THYROIDECTOMY, PARTIAL Left 2011  . TUBAL LIGATION     Social History   Social History  . Marital status: Married    Spouse name: N/A  . Number of children: N/A  . Years of education: N/A   Social History Main Topics  . Smoking status: Never Smoker  . Smokeless tobacco: Never Used  . Alcohol use No  . Drug use: No  . Sexual activity: Yes    Birth control/ protection: Surgical     Comment: tubal   Other Topics Concern  . None   Social History Narrative  . None   Outpatient Encounter Prescriptions as of 12/17/2015  Medication Sig  . Ascorbic Acid (VITAMIN C PO) Take 1 tablet by mouth daily.  . B Complex-C (SUPER B COMPLEX PO) Take by mouth daily.  . calcium carbonate (OS-CAL - DOSED IN MG OF ELEMENTAL CALCIUM) 1250 (500 Ca) MG tablet TAKE ONE (1) TABLET THREE (3) TIMES EACH DAY  . Cholecalciferol (VITAMIN D PO) Take 1 tablet by mouth daily.  . diclofenac (VOLTAREN) 75 MG EC tablet Take 1 tablet (75 mg total) by mouth 2 (two) times daily with a meal.  . Glucosamine-Chondroitin (MOVE FREE PO) Take 1 capsule by mouth daily.  Marland Kitchen levothyroxine (SYNTHROID, LEVOTHROID) 150 MCG tablet Take 1 tablet (150 mcg total) by mouth daily before breakfast.  . Multiple Vitamins-Minerals (EYE VITAMINS PO) Take 2 capsules by mouth daily.   .  Multiple Vitamins-Minerals (MULTIVITAMINS THER. W/MINERALS) TABS tablet Take 1 tablet by mouth daily.   . [DISCONTINUED] CALCIUM PO Take 1,250 mg by mouth daily.  . [DISCONTINUED] levothyroxine (SYNTHROID, LEVOTHROID) 150 MCG tablet Take 1 tablet (150 mcg total) by mouth daily before breakfast.   No facility-administered encounter medications on file as of 12/17/2015.    ALLERGIES: No Known Allergies VACCINATION STATUS: Immunization History  Administered Date(s) Administered  . Influenza,inj,Quad PF,36+ Mos 03/20/2014    HPI  60 yr old female with medical hx as above. She is here  to follow-up for postsurgical hypothyroidism . She is on levothyroxine  150 g by mouth every morning. She is also on calcium supplement for transient hypocalcemia following her most recent surgery. She has had history of multinodular goiter , on follow-up ultrasound and the FNA which showed FLUS, and underwent completion thyroidectomy with benign outcomes.  She underwent her first  left hemithyroidectomy in 2000 for MNG, reportedly benign.  she c/o stress, fatigue. she denies dysphagia, SOB.   Review of Systems Constitutional: no weight gain/loss, no fatigue, no subjective hyperthermia/hypothermia Eyes: no blurry vision, no xerophthalmia ENT: no sore throat, no nodules palpated in throat, no dysphagia/odynophagia, no hoarseness Cardiovascular: no CP/SOB/palpitations/leg swelling Respiratory: no cough/SOB Gastrointestinal: no N/V/D/C Musculoskeletal: no muscle/joint aches Skin:  no rashes Neurological: no tremors/numbness/tingling/dizziness Psychiatric: no depression/anxiety  Objective:    BP 129/82   Pulse 88   Ht 5\' 9"  (1.753 m)   Wt 198 lb (89.8 kg)   BMI 29.24 kg/m   Wt Readings from Last 3 Encounters:  12/17/15 198 lb (89.8 kg)  10/30/15 196 lb 3.2 oz (89 kg)  10/17/15 198 lb (89.8 kg)    Physical Exam Constitutional: overweight, in NAD Eyes: PERRLA, EOMI, no exophthalmos ENT: moist  mucous membranes, post  Thyroidectomy scar. no cervical lymphadenopathy Cardiovascular: RRR, No MRG Respiratory: CTA B Gastrointestinal: abdomen soft, NT, ND, BS+ Musculoskeletal: no deformities, strength intact in all 4 Skin: moist, warm, no rashes Neurological: no tremor with outstretched hands, DTR normal in all 4  Her labs from 05/21/2015 showed PTH 25, calcium 8.9 TSH 3.05, free T4 1 0.4 Vitamin D 35  - Complete labs to be scanned into her records.  Assessment & Plan:   1. Postsurgical hypothyroidism She has history of multinodular goiter. She underwent remnant thyroidectomy on 03/19/14, no malignancy. Her labs show she is Slightly over replaced with LT4.  However, I will keep it on the same dose for now. I advised her to continue Synthroid 150 mg by mouth every morning.  - We discussed about correct intake of levothyroxine, at fasting, with water, separated by at least 30 minutes from breakfast, and separated by more than 4 hours from calcium, iron, multivitamins, acid reflux medications (PPIs). -Patient is made aware of the fact that thyroid hormone replacement is needed for life, dose to be adjusted by periodic monitoring of thyroid function tests.  2. Hypocalcemia -Her last  calcium has improved to 8.9 along with normal PTH of 25 and normal vitamin D of 37. -I suggested for her to continue calcium supplement, calcium carbonate 1250 mg (elemental calcium of 500 mg)  to once a day.   - I advised patient to maintain close follow up with PROVIDER NOT [place] for primary care needs. Follow up plan: Return in about 6 months (around 06/15/2016) for follow up with pre-visit labs.  Glade Lloyd, MD Phone: [phone]  Fax: [phone]   12/17/2015, 2:53 PM

## 2016-06-08 ENCOUNTER — Other Ambulatory Visit: Payer: Self-pay | Admitting: "Endocrinology

## 2016-06-08 LAB — COMPLETE METABOLIC PANEL WITH GFR
ALBUMIN: 4.3 g/dL (ref 3.6–5.1)
ALK PHOS: 51 U/L (ref 33–130)
ALT: 16 U/L (ref 6–29)
AST: 20 U/L (ref 10–35)
BILIRUBIN TOTAL: 0.4 mg/dL (ref 0.2–1.2)
BUN: 14 mg/dL (ref 7–25)
CO2: 31 mmol/L (ref 20–31)
Calcium: 9 mg/dL (ref 8.6–10.4)
Chloride: 101 mmol/L (ref 98–110)
Creat: 0.78 mg/dL (ref 0.50–0.99)
GFR, Est African American: >89 mL/min (ref >=60)
GFR, Est Non African American: 83 mL/min (ref >=60)
GLUCOSE: 88 mg/dL (ref 65–99)
Potassium: 4.2 mmol/L (ref 3.5–5.3)
SODIUM: 140 mmol/L (ref 135–146)
TOTAL PROTEIN: 6.8 g/dL (ref 6.1–8.1)

## 2016-06-08 LAB — T4, FREE: Free T4: 1.5 ng/dL (ref 0.8–1.8)

## 2016-06-08 LAB — TSH: TSH: 0.04 mIU/L — ABNORMAL LOW

## 2016-06-08 LAB — T3, FREE: T3, Free: 3.4 pg/mL (ref 2.3–4.2)

## 2016-06-11 ENCOUNTER — Other Ambulatory Visit: Payer: Self-pay | Admitting: Adult Health

## 2016-06-11 DIAGNOSIS — Z1231 Encounter for screening mammogram for malignant neoplasm of breast: Secondary | ICD-10-CM

## 2016-06-15 ENCOUNTER — Encounter: Payer: Self-pay | Admitting: "Endocrinology

## 2016-06-15 ENCOUNTER — Ambulatory Visit (INDEPENDENT_AMBULATORY_CARE_PROVIDER_SITE_OTHER): Payer: PRIVATE HEALTH INSURANCE | Admitting: "Endocrinology

## 2016-06-15 VITALS — BP 104/73 | HR 67 | Ht 69.0 in | Wt 195.0 lb

## 2016-06-15 DIAGNOSIS — E890 Postprocedural hypothyroidism: Secondary | ICD-10-CM | POA: Diagnosis not present

## 2016-06-15 MED ORDER — LEVOTHYROXINE SODIUM 137 MCG PO TABS: 137.0000 ug | ORAL_TABLET | Freq: Every day | ORAL | 1 refills | Status: DC

## 2016-06-15 NOTE — Progress Notes (Signed)
Subjective:    Patient ID: Colleen Torres, female    DOB: 09-10-1955, PCP PROVIDER NOT IN SYSTEM   Past Medical History:  Diagnosis Date  . Abdominal pain, RLQ 09/26/2015  . Arthritis    Bilateral Hands  . Cancer (HCC)    Follicular neoplasm of thyroid  . Depression   . Dysrhythmia   . GERD (gastroesophageal reflux disease)   . Hypothyroidism    Past Surgical History:  Procedure Laterality Date  . APPENDECTOMY    . CYST REMOVAL NECK    . SUPRAVENTRICULAR TACHYCARDIA ABLATION  09/2002  . THYROIDECTOMY Right 03/19/2014   Procedure: RIGHT THYROID LOBECTOMY;  Surgeon: Jamesetta So, MD;  Location: [facility];  Service: General;  Laterality: Right;  . THYROIDECTOMY, PARTIAL Left 2011  . TUBAL LIGATION     Social History   Social History  . Marital status: Married    Spouse name: N/A  . Number of children: N/A  . Years of education: N/A   Social History Main Topics  . Smoking status: Never Smoker  . Smokeless tobacco: Never Used  . Alcohol use No  . Drug use: No  . Sexual activity: Yes    Birth control/ protection: Surgical     Comment: tubal   Other Topics Concern  . None   Social History Narrative  . None   Outpatient Encounter Prescriptions as of 06/15/2016  Medication Sig  . Ascorbic Acid (VITAMIN C PO) Take 1 tablet by mouth daily.  . B Complex-C (SUPER B COMPLEX PO) Take by mouth daily.  . calcium carbonate (OS-CAL - DOSED IN MG OF ELEMENTAL CALCIUM) 1250 (500 Ca) MG tablet TAKE ONE (1) TABLET THREE (3) TIMES EACH DAY  . Cholecalciferol (VITAMIN D PO) Take 1 tablet by mouth daily.  . Glucosamine-Chondroitin (MOVE FREE PO) Take 1 capsule by mouth daily.  Marland Kitchen levothyroxine (SYNTHROID, LEVOTHROID) 137 MCG tablet Take 1 tablet (137 mcg total) by mouth daily before breakfast.  . Multiple Vitamins-Minerals (EYE VITAMINS PO) Take 2 capsules by mouth daily.   . [DISCONTINUED] diclofenac (VOLTAREN) 75 MG EC tablet Take 1 tablet (75 mg total) by mouth 2 (two) times daily  with a meal.  . [DISCONTINUED] levothyroxine (SYNTHROID, LEVOTHROID) 150 MCG tablet Take 1 tablet (150 mcg total) by mouth daily before breakfast.  . [DISCONTINUED] Multiple Vitamins-Minerals (MULTIVITAMINS THER. W/MINERALS) TABS tablet Take 1 tablet by mouth daily.    No facility-administered encounter medications on file as of 06/15/2016.    ALLERGIES: No Known Allergies VACCINATION STATUS: Immunization History  Administered Date(s) Administered  . Influenza,inj,Quad PF,36+ Mos 03/20/2014    HPI  61 yr old female with medical hx as above. She is here  to follow-up for postsurgical hypothyroidism . She is on levothyroxine  150 g by mouth every morning. She is also on calcium supplement for transient hypocalcemia following her most recent surgery. She has had history of multinodular goiter , on follow-up ultrasound and the FNA which showed FLUS, and underwent completion thyroidectomy with benign outcomes.  She underwent her first  left hemithyroidectomy in 2000 for MNG, reportedly benign.  she  has no new complaints today. she denies dysphagia, SOB.   Review of Systems Constitutional: no weight gain/loss, no fatigue, no subjective hyperthermia/hypothermia Eyes: no blurry vision, no xerophthalmia ENT: no sore throat, no nodules palpated in throat, no dysphagia/odynophagia, no hoarseness Cardiovascular: no CP/SOB/palpitations/leg swelling Respiratory: no cough/SOB Gastrointestinal: no N/V/D/C Musculoskeletal: no muscle/joint aches Skin: no rashes Neurological: no tremors/numbness/tingling/dizziness Psychiatric:  no depression/anxiety  Objective:    BP 104/73   Pulse 67   Ht 5\' 9"  (1.753 m)   Wt 195 lb (88.5 kg)   BMI 28.80 kg/m   Wt Readings from Last 3 Encounters:  06/15/16 195 lb (88.5 kg)  12/17/15 198 lb (89.8 kg)  10/30/15 196 lb 3.2 oz (89 kg)    Physical Exam Constitutional: overweight, in NAD Eyes: PERRLA, EOMI, no exophthalmos ENT: moist mucous membranes, post   Thyroidectomy scar. no cervical lymphadenopathy Cardiovascular: RRR, No MRG Respiratory: CTA B Gastrointestinal: abdomen soft, NT, ND, BS+ Musculoskeletal: no deformities, strength intact in all 4 Skin: moist, warm, no rashes Neurological: no tremor with outstretched hands, DTR normal in all 4   Recent Results (from the past 2160 hour(s))  COMPLETE METABOLIC PANEL WITH GFR     Status: None   Collection Time: 06/08/16 12:26 PM  Result Value Ref Range   Sodium 140 135 - 146 mmol/L   Potassium 4.2 3.5 - 5.3 mmol/L   Chloride 101 98 - 110 mmol/L   CO2 31 20 - 31 mmol/L   Glucose, Bld 88 65 - 99 mg/dL   BUN 14 7 - 25 mg/dL   Creat 0.78 0.50 - 0.99 mg/dL    Comment:   For patients > or = 61 years of age: The upper reference limit for Creatinine is approximately 13% higher for people identified as African-American.      Total Bilirubin 0.4 0.2 - 1.2 mg/dL   Alkaline Phosphatase 51 33 - 130 U/L   AST 20 10 - 35 U/L   ALT 16 6 - 29 U/L   Total Protein 6.8 6.1 - 8.1 g/dL   Albumin 4.3 3.6 - 5.1 g/dL   Calcium 9.0 8.6 - 10.4 mg/dL   GFR, Est African American >89 >=60 mL/min   GFR, Est Non African American 83 >=60 mL/min  TSH     Status: Abnormal   Collection Time: 06/08/16 12:26 PM  Result Value Ref Range   TSH 0.04 (L) mIU/L    Comment:   Reference Range   > or = 20 Years  0.40-4.50   Pregnancy Range First trimester  0.26-2.66 Second trimester 0.55-2.73 Third trimester  0.43-2.91     T4, free     Status: None   Collection Time: 06/08/16 12:26 PM  Result Value Ref Range   Free T4 1.5 0.8 - 1.8 ng/dL  T3, free     Status: None   Collection Time: 06/08/16 12:26 PM  Result Value Ref Range   T3, Free 3.4 2.3 - 4.2 pg/mL    Her labs from 05/21/2015 showed PTH 25, calcium 8.9 TSH 3.05, free T4 1 0.4 Vitamin D 35  - Complete labs to be scanned into her records.  Assessment & Plan:   1. Postsurgical hypothyroidism She has history of multinodular goiter. She underwent  remnant thyroidectomy on 03/19/14, no malignancy. Her labs show she is Slightly over replaced with LT4.  - I will Lower her Synthroid to 137 g by mouth every morning.  - We discussed about correct intake of levothyroxine, at fasting, with water, separated by at least 30 minutes from breakfast, and separated by more than 4 hours from calcium, iron, multivitamins, acid reflux medications (PPIs). -Patient is made aware of the fact that thyroid hormone replacement is needed for life, dose to be adjusted by periodic monitoring of thyroid function tests.  2. Hypocalcemia -Her last  calcium has improved to 8.9 along with normal  PTH of 25 and normal vitamin D of 37. -I suggested for her to continue calcium supplement, calcium carbonate 1250 mg (elemental calcium of 500 mg)  to once a day.  - She will have her first bone density screening for osteoporosis. She has family history of foster process. - I advised patient to maintain close follow up with PROVIDER NOT [place] for primary care needs. Follow up plan: Return in about 6 months (around 12/16/2016) for follow up with pre-visit labs.  Glade Lloyd, MD Phone: [phone]  Fax: [phone]   06/15/2016, 1:14 PM

## 2016-06-19 ENCOUNTER — Ambulatory Visit (HOSPITAL_COMMUNITY): Payer: PRIVATE HEALTH INSURANCE

## 2016-06-26 ENCOUNTER — Ambulatory Visit (HOSPITAL_COMMUNITY)
Admission: RE | Admit: 2016-06-26 | Discharge: 2016-06-26 | Disposition: A | Discharge status: Home / Self Care | Payer: PRIVATE HEALTH INSURANCE | Source: Ambulatory Visit | Attending: Adult Health | Admitting: Adult Health

## 2016-06-26 DIAGNOSIS — Z1231 Encounter for screening mammogram for malignant neoplasm of breast: Secondary | ICD-10-CM | POA: Diagnosis not present

## 2016-12-07 ENCOUNTER — Other Ambulatory Visit: Payer: Self-pay | Admitting: "Endocrinology

## 2016-12-08 LAB — TSH: TSH: 0.04 mIU/L — ABNORMAL LOW

## 2016-12-08 LAB — T4, FREE: FREE T4: 1.7 ng/dL (ref 0.8–1.8)

## 2016-12-16 ENCOUNTER — Ambulatory Visit: Payer: PRIVATE HEALTH INSURANCE | Admitting: "Endocrinology

## 2016-12-18 ENCOUNTER — Ambulatory Visit (INDEPENDENT_AMBULATORY_CARE_PROVIDER_SITE_OTHER): Payer: PRIVATE HEALTH INSURANCE | Admitting: "Endocrinology

## 2016-12-18 ENCOUNTER — Encounter: Payer: Self-pay | Admitting: "Endocrinology

## 2016-12-18 VITALS — BP 117/77 | HR 82 | Ht 69.0 in | Wt 189.0 lb

## 2016-12-18 DIAGNOSIS — E890 Postprocedural hypothyroidism: Secondary | ICD-10-CM | POA: Diagnosis not present

## 2016-12-18 MED ORDER — LEVOTHYROXINE SODIUM 137 MCG PO TABS: 137.0000 ug | ORAL_TABLET | Freq: Every day | ORAL | 1 refills | Status: DC

## 2016-12-18 NOTE — Progress Notes (Signed)
Subjective:    Patient ID: Stanton Kidney, female    DOB: April 27, 1955, PCP System, Provider Not In   Past Medical History:  Diagnosis Date  . Abdominal pain, RLQ 09/26/2015  . Arthritis    Bilateral Hands  . Cancer (HCC)    Follicular neoplasm of thyroid  . Depression   . Dysrhythmia   . GERD (gastroesophageal reflux disease)   . Hypothyroidism    Past Surgical History:  Procedure Laterality Date  . APPENDECTOMY    . CYST REMOVAL NECK    . SUPRAVENTRICULAR TACHYCARDIA ABLATION  09/2002  . THYROIDECTOMY Right 03/19/2014   Procedure: RIGHT THYROID LOBECTOMY;  Surgeon: Jamesetta So, MD;  Location: [facility];  Service: General;  Laterality: Right;  . THYROIDECTOMY, PARTIAL Left 2011  . TUBAL LIGATION     Social History   Social History  . Marital status: Married    Spouse name: N/A  . Number of children: N/A  . Years of education: N/A   Social History Main Topics  . Smoking status: Never Smoker  . Smokeless tobacco: Never Used  . Alcohol use No  . Drug use: No  . Sexual activity: Yes    Birth control/ protection: Surgical     Comment: tubal   Other Topics Concern  . Not on file   Social History Narrative  . No narrative on file   Outpatient Encounter Prescriptions as of 12/18/2016  Medication Sig  . Ascorbic Acid (VITAMIN C PO) Take 1 tablet by mouth daily.  . B Complex-C (SUPER B COMPLEX PO) Take by mouth daily.  . calcium carbonate (OS-CAL - DOSED IN MG OF ELEMENTAL CALCIUM) 1250 (500 Ca) MG tablet TAKE ONE (1) TABLET THREE (3) TIMES EACH DAY  . Cholecalciferol (VITAMIN D PO) Take 1 tablet by mouth daily.  . Glucosamine-Chondroitin (MOVE FREE PO) Take 1 capsule by mouth daily.  Marland Kitchen levothyroxine (SYNTHROID, LEVOTHROID) 137 MCG tablet Take 1 tablet (137 mcg total) by mouth daily before breakfast.  . Multiple Vitamins-Minerals (EYE VITAMINS PO) Take 2 capsules by mouth daily.   . [DISCONTINUED] levothyroxine (SYNTHROID, LEVOTHROID) 137 MCG tablet Take 1 tablet (137  mcg total) by mouth daily before breakfast.   No facility-administered encounter medications on file as of 12/18/2016.    ALLERGIES: No Known Allergies VACCINATION STATUS: Immunization History  Administered Date(s) Administered  . Influenza,inj,Quad PF,6+ Mos 03/20/2014    HPI  61 yr old female with medical hx as above. She is here  to follow-up for postsurgical hypothyroidism . She is on levothyroxine  137 g by mouth every morning. She is also on calcium supplement for transient hypocalcemia following her most recent surgery. She has had history of multinodular goiter , on follow-up ultrasound guided FNA which showed FLUS, and underwent completion thyroidectomy with benign outcomes- December 2015.  She underwent her first  left hemithyroidectomy in 2000 for MNG, reportedly benign.  she  has no new complaints today. she denies dysphagia, SOB.  Review of Systems Constitutional: + Lost 7 pounds since last visit , no fatigue, no subjective hyperthermia/hypothermia Eyes: no blurry vision, no xerophthalmia ENT: no sore throat, no nodules palpated in throat, no dysphagia/odynophagia, no hoarseness Cardiovascular: no CP/SOB/palpitations/leg swelling Respiratory: no cough/SOB Gastrointestinal: no N/V/D/C Musculoskeletal: no muscle/joint aches Skin: no rashes Neurological: no tremors/numbness/tingling/dizziness Psychiatric: no depression/anxiety  Objective:    BP 117/77   Pulse 82   Ht 5\' 9"  (1.753 m)   Wt 189 lb (85.7 kg)   BMI 27.91  kg/m   Wt Readings from Last 3 Encounters:  12/18/16 189 lb (85.7 kg)  06/15/16 195 lb (88.5 kg)  12/17/15 198 lb (89.8 kg)    Physical Exam Constitutional: overweight, in NAD Eyes: PERRLA, EOMI, no exophthalmos ENT: moist mucous membranes, post  Thyroidectomy scar. no cervical lymphadenopathy Cardiovascular: RRR, No MRG Respiratory: CTA B Gastrointestinal: abdomen soft, NT, ND, BS+ Musculoskeletal: no deformities, strength intact in all  4 Skin: moist, warm, no rashes Neurological: no tremor with outstretched hands, DTR normal in all 4   Recent Results (from the past 2160 hour(s))  TSH     Status: Abnormal   Collection Time: 12/07/16  9:48 AM  Result Value Ref Range   TSH 0.04 (L) mIU/L    Comment:   Reference Range   > or = 20 Years  0.40-4.50   Pregnancy Range First trimester  0.26-2.66 Second trimester 0.55-2.73 Third trimester  0.43-2.91     T4, free     Status: None   Collection Time: 12/07/16  9:48 AM  Result Value Ref Range   Free T4 1.7 0.8 - 1.8 ng/dL   On  02/26//2017 calcium 9 TSH 3.05, free T4 1 0.4 , Vitamin D 35  On 05/20/2015 PTH was 25 , calcium was 8.9    Assessment & Plan:   1. Postsurgical hypothyroidism She has history of multinodular goiter. She underwent remnant thyroidectomy on 03/19/14, no malignancy. Her thyroid function tests are consistent with slight over replacement. - However she has no clinical symptoms or signs of thyrotoxicosis. She will benefit from the current dose of levothyroxine, heading into the colder season. I advised her to continue levothyroxine 137 g by mouth every morning.  - We discussed about correct intake of levothyroxine, at fasting, with water, separated by at least 30 minutes from breakfast, and separated by more than 4 hours from calcium, iron, multivitamins, acid reflux medications (PPIs). -Patient is made aware of the fact that thyroid hormone replacement is needed for life, dose to be adjusted by periodic monitoring of thyroid function tests.  2. Hypocalcemia -Her last  calcium has improved to 9 along with normal PTH of 25 and normal vitamin D of 37. -I suggested for her to continue calcium supplement, calcium carbonate 1250 mg (elemental calcium of 500 mg)  to once a day.  - She missed  her first bone density screening for osteoporosis. She has family history of osteoporosis. - I advised patient to maintain close follow up at [facility]  for primary care needs. Follow up plan: Return in about 6 months (around 06/17/2017) for follow up with pre-visit labs.  Glade Lloyd, MD Phone: [phone]  Fax: [phone]   12/18/2016, 11:16 AM

## 2017-06-09 LAB — RENAL FUNCTION PANEL
Albumin: 4.4 g/dL (ref 3.6–5.1)
BUN: 15 mg/dL (ref 7–25)
CO2: 31 mmol/L (ref 20–32)
Calcium: 9 mg/dL (ref 8.6–10.4)
Chloride: 103 mmol/L (ref 98–110)
Creat: 0.85 mg/dL (ref 0.50–0.99)
Glucose, Bld: 83 mg/dL (ref 65–139)
PHOSPHORUS: 4 mg/dL (ref 2.5–4.5)
Potassium: 4.4 mmol/L (ref 3.5–5.3)
SODIUM: 141 mmol/L (ref 135–146)

## 2017-06-09 LAB — T4, FREE: FREE T4: 1.7 ng/dL (ref 0.8–1.8)

## 2017-06-09 LAB — TSH: TSH: 0.07 mIU/L — ABNORMAL LOW (ref 0.40–4.50)

## 2017-06-17 ENCOUNTER — Ambulatory Visit (INDEPENDENT_AMBULATORY_CARE_PROVIDER_SITE_OTHER): Payer: PRIVATE HEALTH INSURANCE | Admitting: "Endocrinology

## 2017-06-17 ENCOUNTER — Other Ambulatory Visit: Payer: Self-pay | Admitting: "Endocrinology

## 2017-06-17 ENCOUNTER — Encounter: Payer: Self-pay | Admitting: "Endocrinology

## 2017-06-17 VITALS — BP 130/82 | HR 80 | Ht 69.0 in | Wt 168.0 lb

## 2017-06-17 DIAGNOSIS — Z780 Asymptomatic menopausal state: Secondary | ICD-10-CM | POA: Diagnosis not present

## 2017-06-17 DIAGNOSIS — E890 Postprocedural hypothyroidism: Secondary | ICD-10-CM | POA: Diagnosis not present

## 2017-06-17 DIAGNOSIS — E039 Hypothyroidism, unspecified: Secondary | ICD-10-CM

## 2017-06-17 MED ORDER — LEVOTHYROXINE SODIUM 125 MCG PO TABS: 125.0000 ug | ORAL_TABLET | Freq: Every day | ORAL | 6 refills | Status: DC

## 2017-06-17 NOTE — Progress Notes (Signed)
Subjective:    Patient ID: Colleen Torres, female    DOB: [birth year], PCP System, Provider Not In   Past Medical History:  Diagnosis Date  . Abdominal pain, RLQ 09/26/2015  . Arthritis    Bilateral Hands  . Cancer (HCC)    Follicular neoplasm of thyroid  . Depression   . Dysrhythmia   . GERD (gastroesophageal reflux disease)   . Hypothyroidism    Past Surgical History:  Procedure Laterality Date  . APPENDECTOMY    . CYST REMOVAL NECK    . SUPRAVENTRICULAR TACHYCARDIA ABLATION  09/2002  . THYROIDECTOMY Right 03/19/2014   Procedure: RIGHT THYROID LOBECTOMY;  Surgeon: Jamesetta So, MD;  Location: [facility];  Service: General;  Laterality: Right;  . THYROIDECTOMY, PARTIAL Left 2011  . TUBAL LIGATION     Social History   Socioeconomic History  . Marital status: Married    Spouse name: None  . Number of children: None  . Years of education: None  . Highest education level: None  Social Needs  . Financial resource strain: None  . Food insecurity - worry: None  . Food insecurity - inability: None  . Transportation needs - medical: None  . Transportation needs - non-medical: None  Occupational History  . None  Tobacco Use  . Smoking status: Never Smoker  . Smokeless tobacco: Never Used  Substance and Sexual Activity  . Alcohol use: No  . Drug use: No  . Sexual activity: Yes    Birth control/protection: Surgical    Comment: tubal  Other Topics Concern  . None  Social History Narrative  . None   Outpatient Encounter Medications as of 06/17/2017  Medication Sig  . Ascorbic Acid (VITAMIN C PO) Take 1 tablet by mouth daily.  . B Complex-C (SUPER B COMPLEX PO) Take by mouth daily.  . calcium carbonate (OS-CAL - DOSED IN MG OF ELEMENTAL CALCIUM) 1250 (500 Ca) MG tablet TAKE ONE (1) TABLET THREE (3) TIMES EACH DAY  . Cholecalciferol (VITAMIN D PO) Take 1 tablet by mouth daily.  . Glucosamine-Chondroitin (MOVE FREE PO) Take 1 capsule by mouth daily.  Marland Kitchen levothyroxine  (SYNTHROID, LEVOTHROID) 125 MCG tablet Take 1 tablet (125 mcg total) by mouth daily before breakfast.  . Multiple Vitamins-Minerals (EYE VITAMINS PO) Take 2 capsules by mouth daily.   . [DISCONTINUED] levothyroxine (SYNTHROID, LEVOTHROID) 137 MCG tablet Take 1 tablet (137 mcg total) by mouth daily before breakfast.   No facility-administered encounter medications on file as of 06/17/2017.    ALLERGIES: No Known Allergies VACCINATION STATUS: Immunization History  Administered Date(s) Administered  . Influenza,inj,Quad PF,6+ Mos 03/20/2014    HPI  62 yr old female with medical hx as above. She is here  to follow-up for postsurgical hypothyroidism . She is on levothyroxine  137 g by mouth every morning.  She reports compliance.  She is also on calcium supplement for hypocalcemia following her most recent surgery. She has had history of multinodular goiter , on follow-up ultrasound guided FNA which showed FLUS, and underwent completion thyroidectomy with benign outcomes- December 2015.  She underwent her first  left hemithyroidectomy in 2000 for MNG, reportedly benign.  -She reports on and off palpitations, heat intolerance, sleep disturbance.  Over the last 6 months she has lost 30 pounds, most of it intentional. - she denies dysphagia, SOB.  Review of Systems Constitutional: + Lost 30 pounds over the last 6 months, + fatigue, +subjective hyperthermia Eyes: no blurry vision, no xerophthalmia  ENT: no sore throat, no nodules palpated in throat, no dysphagia/odynophagia, no hoarseness Cardiovascular: + palpitations,  - leg swelling Respiratory: no cough/SOB Gastrointestinal: no N/V/D/C Musculoskeletal: no muscle/joint aches Skin: no rashes Neurological: + tremors Psychiatric: no depression, no anxiety  Objective:    BP 130/82   Pulse 80   Ht 5\' 9"  (1.753 m)   Wt 168 lb (76.2 kg)   BMI 24.81 kg/m   Wt Readings from Last 3 Encounters:  06/17/17 168 lb (76.2 kg)  12/18/16 189 lb  (85.7 kg)  06/15/16 195 lb (88.5 kg)    Physical Exam Constitutional: + Procrit weight for height, not in acute distress.   Eyes: PERRLA, EOMI, no exophthalmos ENT: moist mucous membranes, post  Thyroidectomy scar. no cervical lymphadenopathy Cardiovascular: RRR, No MRG Respiratory: CTA B Gastrointestinal: abdomen soft, NT, ND, BS+ Musculoskeletal: no deformities, strength intact in all 4 Skin: moist, warm, no rashes Neurological: + tremor with outstretched hands, DTR normal in all 4   Recent Results (from the past 2160 hour(s))  T4, free     Status: None   Collection Time: 06/09/17  9:01 AM  Result Value Ref Range   Free T4 1.7 0.8 - 1.8 ng/dL  TSH     Status: Abnormal   Collection Time: 06/09/17  9:01 AM  Result Value Ref Range   TSH 0.07 (L) 0.40 - 4.50 mIU/L  Renal function panel     Status: None   Collection Time: 06/09/17  9:01 AM  Result Value Ref Range   Glucose, Bld 83 65 - 139 mg/dL    Comment: .        Non-fasting reference interval .    BUN 15 7 - 25 mg/dL   Creat 0.85 0.50 - 0.99 mg/dL    Comment: For patients >53 years of age, the reference limit for Creatinine is approximately 13% higher for people identified as African-American. .    BUN/Creatinine Ratio NOT APPLICABLE 6 - 22 (calc)   Sodium 141 135 - 146 mmol/L   Potassium 4.4 3.5 - 5.3 mmol/L   Chloride 103 98 - 110 mmol/L   CO2 31 20 - 32 mmol/L   Calcium 9.0 8.6 - 10.4 mg/dL   Phosphorus 4.0 2.5 - 4.5 mg/dL   Albumin 4.4 3.6 - 5.1 g/dL   On  02/26//2017 calcium 9 TSH 3.05, free T4 1 0.4 , Vitamin D 35  On 05/20/2015 PTH was 25 , calcium was 8.9    Assessment & Plan:   1. Postsurgical hypothyroidism She has history of multinodular goiter. She underwent remnant thyroidectomy on 03/19/14, no malignancy. Her thyroid function tests are consistent with slight over replacement. -I discussed and lower her levothyroxine to 125 mcg p.o. every morning.  - We discussed about correct intake of  levothyroxine, at fasting, with water, separated by at least 30 minutes from breakfast, and separated by more than 4 hours from calcium, iron, multivitamins, acid reflux medications (PPIs). -Patient is made aware of the fact that thyroid hormone replacement is needed for life, dose to be adjusted by periodic monitoring of thyroid function tests.   2. Hypocalcemia -Her last  calcium has improved to 9 along with normal PTH of 25 and normal vitamin D of 37. -I suggested for her to continue calcium supplement, calcium carbonate 1250 mg (elemental calcium of 500 mg)  to once a day.  - She missed  her first bone density screening for osteoporosis. She has family history of osteoporosis.  She is considering  to have it done before her next visit. -Due to some numbness on her feet, I added vitamin B12 test to her next blood work. - I advised patient to maintain close follow up at [facility]  for primary care needs. Follow up plan: Return in about 6 months (around 12/18/2017) for follow up with Bone Density, follow up with pre-visit labs.  Glade Lloyd, MD Phone: [phone]  Fax: [phone]  -  This note was partially dictated with voice recognition software. Similar sounding words can be transcribed inadequately or may not  be corrected upon review.  06/17/2017, 10:13 AM

## 2017-06-30 ENCOUNTER — Other Ambulatory Visit (HOSPITAL_COMMUNITY): Payer: PRIVATE HEALTH INSURANCE

## 2017-07-12 ENCOUNTER — Ambulatory Visit (HOSPITAL_COMMUNITY)
Admission: RE | Admit: 2017-07-12 | Discharge: 2017-07-12 | Disposition: A | Discharge status: Home / Self Care | Payer: PRIVATE HEALTH INSURANCE | Source: Ambulatory Visit | Attending: "Endocrinology | Admitting: "Endocrinology

## 2017-07-12 DIAGNOSIS — M8589 Other specified disorders of bone density and structure, multiple sites: Secondary | ICD-10-CM | POA: Insufficient documentation

## 2017-07-12 DIAGNOSIS — Z780 Asymptomatic menopausal state: Secondary | ICD-10-CM

## 2017-12-11 LAB — RENAL FUNCTION PANEL
Albumin: 4.2 g/dL (ref 3.6–5.1)
BUN: 11 mg/dL (ref 7–25)
CO2: 30 mmol/L (ref 20–32)
Calcium: 8.8 mg/dL (ref 8.6–10.4)
Chloride: 104 mmol/L (ref 98–110)
Creat: 0.76 mg/dL (ref 0.50–0.99)
Glucose, Bld: 81 mg/dL (ref 65–99)
Phosphorus: 4 mg/dL (ref 2.5–4.5)
Potassium: 4.1 mmol/L (ref 3.5–5.3)
Sodium: 140 mmol/L (ref 135–146)

## 2017-12-11 LAB — T4, FREE: Free T4: 1.4 ng/dL (ref 0.8–1.8)

## 2017-12-11 LAB — TSH: TSH: 0.06 mIU/L — ABNORMAL LOW (ref 0.40–4.50)

## 2017-12-11 LAB — VITAMIN B12: Vitamin B-12: 681 pg/mL (ref 200–1100)

## 2017-12-20 ENCOUNTER — Ambulatory Visit (INDEPENDENT_AMBULATORY_CARE_PROVIDER_SITE_OTHER): Payer: PRIVATE HEALTH INSURANCE | Admitting: "Endocrinology

## 2017-12-20 ENCOUNTER — Encounter: Payer: Self-pay | Admitting: "Endocrinology

## 2017-12-20 VITALS — BP 104/70 | HR 70 | Ht 69.0 in | Wt 177.0 lb

## 2017-12-20 DIAGNOSIS — E890 Postprocedural hypothyroidism: Secondary | ICD-10-CM

## 2017-12-20 DIAGNOSIS — M8589 Other specified disorders of bone density and structure, multiple sites: Secondary | ICD-10-CM | POA: Diagnosis not present

## 2017-12-20 MED ORDER — LEVOTHYROXINE SODIUM 125 MCG PO TABS: 125.0000 ug | ORAL_TABLET | Freq: Every day | ORAL | 2 refills | Status: DC

## 2017-12-20 NOTE — Progress Notes (Signed)
Endocrinology follow-up note    Subjective:    Patient ID: Colleen Torres, female    DOB: [birth year], PCP System, Provider Not In   Past Medical History:  Diagnosis Date  . Abdominal pain, RLQ 09/26/2015  . Arthritis    Bilateral Hands  . Cancer (HCC)    Follicular neoplasm of thyroid  . Depression   . Dysrhythmia   . GERD (gastroesophageal reflux disease)   . Hypothyroidism    Past Surgical History:  Procedure Laterality Date  . APPENDECTOMY    . CYST REMOVAL NECK    . SUPRAVENTRICULAR TACHYCARDIA ABLATION  09/2002  . THYROIDECTOMY Right 03/19/2014   Procedure: RIGHT THYROID LOBECTOMY;  Surgeon: Jamesetta So, MD;  Location: [facility];  Service: General;  Laterality: Right;  . THYROIDECTOMY, PARTIAL Left 2011  . TUBAL LIGATION     Social History   Socioeconomic History  . Marital status: Married    Spouse name: Not on file  . Number of children: Not on file  . Years of education: Not on file  . Highest education level: Not on file  Occupational History  . Not on file  Social Needs  . Financial resource strain: Not on file  . Food insecurity:    Worry: Not on file    Inability: Not on file  . Transportation needs:    Medical: Not on file    Non-medical: Not on file  Tobacco Use  . Smoking status: Never Smoker  . Smokeless tobacco: Never Used  Substance and Sexual Activity  . Alcohol use: No  . Drug use: No  . Sexual activity: Yes    Birth control/protection: Surgical    Comment: tubal  Lifestyle  . Physical activity:    Days per week: Not on file    Minutes per session: Not on file  . Stress: Not on file  Relationships  . Social connections:    Talks on phone: Not on file    Gets together: Not on file    Attends religious service: Not on file    Active member of club or organization: Not on file    Attends meetings of clubs or organizations: Not on file    Relationship status: Not on file  Other Topics Concern  . Not on file  Social History  Narrative  . Not on file   Outpatient Encounter Medications as of 12/20/2017  Medication Sig  . Ascorbic Acid (VITAMIN C PO) Take 1 tablet by mouth daily.  . B Complex-C (SUPER B COMPLEX PO) Take by mouth daily.  . calcium carbonate (OS-CAL - DOSED IN MG OF ELEMENTAL CALCIUM) 1250 (500 Ca) MG tablet TAKE ONE (1) TABLET THREE (3) TIMES EACH DAY  . Cholecalciferol (VITAMIN D PO) Take 1 tablet by mouth daily.  . Glucosamine-Chondroitin (MOVE FREE PO) Take 1 capsule by mouth daily.  Marland Kitchen levothyroxine (SYNTHROID, LEVOTHROID) 125 MCG tablet Take 1 tablet (125 mcg total) by mouth daily before breakfast.  . Multiple Vitamins-Minerals (EYE VITAMINS PO) Take 2 capsules by mouth daily.   . [DISCONTINUED] levothyroxine (SYNTHROID, LEVOTHROID) 125 MCG tablet Take 1 tablet (125 mcg total) by mouth daily before breakfast.   No facility-administered encounter medications on file as of 12/20/2017.    ALLERGIES: No Known Allergies VACCINATION STATUS: Immunization History  Administered Date(s) Administered  . Influenza,inj,Quad PF,6+ Mos 03/20/2014    HPI  62 yr old female with medical hx as above. She is here  to follow-up for postsurgical hypothyroidism .  She is on levothyroxine  125 g by mouth every morning.  She reports compliance.  She is also on calcium supplement for hypocalcemia following her most recent thyroid surgery. She has had history of multinodular goiter , on follow-up ultrasound guided FNA which showed FLUS, and underwent completion thyroidectomy with benign outcomes- December 2015.  She underwent her first  left hemithyroidectomy in 2000 for MNG, reportedly benign.  -She reports on and off palpitations, heat intolerance, sleep disturbance.  Over the last 6 months she has lost 30 pounds, most of it intentional. - she denies dysphagia, SOB. -She has gained some weight since last visit, explained that she was not active for the last several weeks. -She underwent bone density study which  showed osteopenia.  Review of Systems Constitutional: + Gained 9 pounds since last visit , + fatigue, +subjective hyperthermia Eyes: no blurry vision, no xerophthalmia ENT: no sore throat, no nodules palpated in throat, no dysphagia/odynophagia, no hoarseness Cardiovascular: + palpitations,  - leg swelling Respiratory: no cough/SOB Gastrointestinal: no N/V/D/C Musculoskeletal: no muscle/joint aches Skin: no rashes Neurological: + tremors Psychiatric: no depression, no anxiety  Objective:    BP 104/70   Pulse 70   Ht 5\' 9"  (1.753 m)   Wt 177 lb (80.3 kg)   BMI 26.14 kg/m   Wt Readings from Last 3 Encounters:  12/20/17 177 lb (80.3 kg)  06/17/17 168 lb (76.2 kg)  12/18/16 189 lb (85.7 kg)    Physical Exam Constitutional: + over weight for height, not in acute distress.   Eyes: PERRLA, EOMI, no exophthalmos ENT: moist mucous membranes, post  Thyroidectomy scar. no cervical lymphadenopathy  Gastrointestinal: abdomen soft, NT, ND, BS+ Musculoskeletal: no deformities, strength intact in all 4 Skin: moist, warm, no rashes Neurological: + tremor with outstretched hands   Recent Results (from the past 2160 hour(s))  Renal function panel     Status: None   Collection Time: 12/10/17  8:03 AM  Result Value Ref Range   Glucose, Bld 81 65 - 99 mg/dL    Comment: .            Fasting reference interval .    BUN 11 7 - 25 mg/dL   Creat 0.76 0.50 - 0.99 mg/dL    Comment: For patients >4 years of age, the reference limit for Creatinine is approximately 13% higher for people identified as African-American. .    BUN/Creatinine Ratio NOT APPLICABLE 6 - 22 (calc)   Sodium 140 135 - 146 mmol/L   Potassium 4.1 3.5 - 5.3 mmol/L   Chloride 104 98 - 110 mmol/L   CO2 30 20 - 32 mmol/L   Calcium 8.8 8.6 - 10.4 mg/dL   Phosphorus 4.0 2.5 - 4.5 mg/dL   Albumin 4.2 3.6 - 5.1 g/dL  T4, Free     Status: None   Collection Time: 12/10/17  8:03 AM  Result Value Ref Range   Free T4 1.4 0.8  - 1.8 ng/dL  TSH     Status: Abnormal   Collection Time: 12/10/17  8:03 AM  Result Value Ref Range   TSH 0.06 (L) 0.40 - 4.50 mIU/L  Vitamin B12     Status: None   Collection Time: 12/10/17  8:03 AM  Result Value Ref Range   Vitamin B-12 681 200 - 1,100 pg/mL   On  02/26//2017 calcium 9 TSH 3.05, free T4 1 0.4 , Vitamin D 35  On 05/20/2015 PTH was 25 , calcium was 8.9   Bone  density on July 12, 2017  [facility] Spine L1-L4 07/12/2017 61.9 Osteopenia -1.3 1.021 g/cm2 DualFemur Neck Right 07/12/2017 61.9 Osteopenia -1.9 0.774 g/cm2 DualFemur Total Mean 07/12/2017 61.9 Osteopenia -1.3 0.850 g/cm2 ASSESSMENT: BMD as determined from Femur Neck Right is 0.774 g/cm2 with a T-Score of -1.9. This patient is considered osteopenic according to [facility]) criteria.   Assessment & Plan:   1. Postsurgical hypothyroidism She has history of multinodular goiter. She underwent remnant thyroidectomy on 03/19/14, no malignancy. Her thyroid function tests are consistent with slight over replacement.  However, she would benefit from staying on her current dose of levothyroxine 125 mcg p.o. every morning.   - We discussed about correct intake of levothyroxine, at fasting, with water, separated by at least 30 minutes from breakfast, and separated by more than 4 hours from calcium, iron, multivitamins, acid reflux medications (PPIs). -Patient is made aware of the fact that thyroid hormone replacement is needed for life, dose to be adjusted by periodic monitoring of thyroid function tests.   2. Hypocalcemia -Her recent calcium is 8.8 mg/dL. -I suggested for her to continue calcium supplement,  calcium carbonate 1250 mg (elemental calcium of 500 mg)  to once a day.    3.  Osteopenia:  She has family history of osteoporosis.   -Her FRAX fracture risk calculation reveals 10-year probability of major osteoporosis-related fracture is 10%, while her risk for hip fracture is close to 0%.    -She will not need bisphosphonates therapy at this time.  I advised her to continue vitamin D and calcium supplements, and plan to repeat bone density in 2 years.    She is advised on combination exercise involving stretch, strength, and resistance components.   - I advised patient to maintain close follow up at [facility]  for primary care needs. Follow up plan: Return in about 6 months (around 06/20/2018) for Follow up with Pre-visit Labs.  Glade Lloyd, MD Phone: [phone]  Fax: [phone]  -  This note was partially dictated with voice recognition software. Similar sounding words can be transcribed inadequately or may not  be corrected upon review.  12/20/2017, 9:09 AM

## 2018-03-07 ENCOUNTER — Telehealth: Payer: Self-pay | Admitting: Adult Health

## 2018-03-07 NOTE — Telephone Encounter (Signed)
Patient called requesting an order for a mammogram.  She is having some pain in her left breast, more when she leans against something or if she presses on it.   [phone]

## 2018-03-07 NOTE — Telephone Encounter (Signed)
Pt called stating that she is having some pain in her left breast and requests that a mammogram be ordered. Advised pt that she would need to be evaluated by a provider first. Pt verbalized understanding and connected to scheduling.

## 2018-03-09 ENCOUNTER — Telehealth: Payer: Self-pay | Admitting: *Deleted

## 2018-03-09 ENCOUNTER — Encounter: Payer: Self-pay | Admitting: Women's Health

## 2018-03-09 ENCOUNTER — Ambulatory Visit (INDEPENDENT_AMBULATORY_CARE_PROVIDER_SITE_OTHER): Payer: PRIVATE HEALTH INSURANCE | Admitting: Women's Health

## 2018-03-09 VITALS — BP 133/79 | HR 73 | Ht 69.0 in | Wt 180.0 lb

## 2018-03-09 DIAGNOSIS — N644 Mastodynia: Secondary | ICD-10-CM

## 2018-03-09 DIAGNOSIS — R5383 Other fatigue: Secondary | ICD-10-CM | POA: Diagnosis not present

## 2018-03-09 DIAGNOSIS — F329 Major depressive disorder, single episode, unspecified: Secondary | ICD-10-CM

## 2018-03-09 DIAGNOSIS — F32A Depression, unspecified: Secondary | ICD-10-CM

## 2018-03-09 MED ORDER — SERTRALINE HCL 25 MG PO TABS: 25.0000 mg | ORAL_TABLET | Freq: Every day | ORAL | 11 refills | Status: DC

## 2018-03-09 NOTE — Patient Instructions (Addendum)
Dr. Cristopher Peru (cardiologist) [phone]  [facility] in [place] ([place]):  [phone] Wed 12/4 @ 11:10am, be there at 10:50am. Do not wear any lotion/powder/deoderant/perfume that morning    Major Depressive Disorder, Adult Major depressive disorder (MDD) is a mental health condition. It may also be called clinical depression or unipolar depression. MDD usually causes feelings of sadness, hopelessness, or helplessness. MDD can also cause physical symptoms. It can interfere with work, school, relationships, and other everyday activities. MDD may be mild, moderate, or severe. It may occur once (single episode major depressive disorder) or it may occur multiple times (recurrent major depressive disorder). What are the causes? The exact cause of this condition is not known. MDD is most likely caused by a combination of things, which may include:  Genetic factors. These are traits that are passed along from parent to child.  Individual factors. Your personality, your behavior, and the way you handle your thoughts and feelings may contribute to MDD. This includes personality traits and behaviors learned from others.  Physical factors, such as: ? Differences in the part of your brain that controls emotion. This part of your brain may be different than it is in people who do not have MDD. ? Long-term (chronic) medical or psychiatric illnesses.  Social factors. Traumatic experiences or major life changes may play a role in the development of MDD.  What increases the risk? This condition is more likely to develop in women. The following factors may also make you more likely to develop MDD:  A family history of depression.  Troubled family relationships.  Abnormally low levels of certain brain chemicals.  Traumatic events in childhood, especially abuse or the loss of a parent.  Being under a lot of stress, or long-term stress, especially from upsetting life  experiences or losses.  A history of: ? Chronic physical illness. ? Other mental health disorders. ? Substance abuse.  Poor living conditions.  Experiencing social exclusion or discrimination on a regular basis.  What are the signs or symptoms? The main symptoms of MDD typically include:  Constant depressed or irritable mood.  Loss of interest in things and activities.  MDD symptoms may also include:  Sleeping or eating too much or too little.  Unexplained weight change.  Fatigue or low energy.  Feelings of worthlessness or guilt.  Difficulty thinking clearly or making decisions.  Thoughts of suicide or of harming others.  Physical agitation or weakness.  Isolation.  Severe cases of MDD may also occur with other symptoms, such as:  Delusions or hallucinations, in which you imagine things that are not real (psychotic depression).  Low-level depression that lasts at least a year (chronic depression or persistent depressive disorder).  Extreme sadness and hopelessness (melancholic depression).  Trouble speaking and moving (catatonic depression).  How is this diagnosed? This condition may be diagnosed based on:  Your symptoms.  Your medical history, including your mental health history. This may involve tests to evaluate your mental health. You may be asked questions about your lifestyle, including any drug and alcohol use, and how long you have had symptoms of MDD.  A physical exam.  Blood tests to rule out other conditions.  You must have a depressed mood and at least four other MDD symptoms most of the day, nearly every day in the same 2-week timeframe before your health care provider can confirm a diagnosis of MDD. How is this treated? This condition is usually treated by mental health professionals, such as psychologists, psychiatrists,  and clinical social workers. You may need more than one type of treatment. Treatment may include:  Psychotherapy. This  is also called talk therapy or counseling. Types of psychotherapy include: ? Cognitive behavioral therapy (CBT). This type of therapy teaches you to recognize unhealthy feelings, thoughts, and behaviors, and replace them with positive thoughts and actions. ? Interpersonal therapy (IPT). This helps you to improve the way you relate to and communicate with others. ? Family therapy. This treatment includes members of your family.  Medicine to treat anxiety and depression, or to help you control certain emotions and behaviors.  Lifestyle changes, such as: ? Limiting alcohol and drug use. ? Exercising regularly. ? Getting plenty of sleep. ? Making healthy eating choices. ? Spending more time outdoors.  Treatments involving stimulation of the brain can be used in situations with extremely severe symptoms, or when medicine or other therapies do not work over time. These treatments include electroconvulsive therapy, transcranial magnetic stimulation, and vagal nerve stimulation. Follow these instructions at home: Activity  Return to your normal activities as told by your health care provider.  Exercise regularly and spend time outdoors as told by your health care provider. General instructions  Take over-the-counter and prescription medicines only as told by your health care provider.  Do not drink alcohol. If you drink alcohol, limit your alcohol intake to no more than 1 drink a day for nonpregnant women and 2 drinks a day for men. One drink equals 12 oz of beer, 5 oz of wine, or 1 oz of hard liquor. Alcohol can affect any antidepressant medicines you are taking. Talk to your health care provider about your alcohol use.  Eat a healthy diet and get plenty of sleep.  Find activities that you enjoy doing, and make time to do them.  Consider joining a support group. Your health care provider may be able to recommend a support group.  Keep all follow-up visits as told by your health care  provider. This is important. Where to find more information: [employer] on Mental Illness  www.nami.org  U.S. National Institute of Mental Health  https://carter.com/  National Suicide Prevention Lifeline  1-800-273-TALK 519-576-8381). This is free, 24-hour help.  Contact a health care provider if:  Your symptoms get worse.  You develop new symptoms. Get help right away if:  You self-harm.  You have serious thoughts about hurting yourself or others.  You see, hear, taste, smell, or feel things that are not present (hallucinate). This information is not intended to replace advice given to you by your health care provider. Make sure you discuss any questions you have with your health care provider. Document Released: 07/25/2012 Document Revised: 12/05/2015 Document Reviewed: 10/09/2015 Elsevier Interactive Patient Education  [employer].

## 2018-03-09 NOTE — Telephone Encounter (Signed)
Called patient and number given to [facility] (in [place]) [phone] for her to call to make an appointment for counseling.

## 2018-03-09 NOTE — Progress Notes (Signed)
GYN VISIT Patient name: Colleen Torres MRN [id number]  Date of birth: Apr 19, 1955 Chief Complaint:   Breast Pain (left breast x1 week)  History of Present Illness:   Colleen Torres is a 62 y.o. G75P1 Caucasian female being seen today for report of pain in center of Lt breast x 1wk. Only hurts w/ palpation or when she brushes up against something. Hasn't been able to feel any lumps/bumps, but states her breasts are generally lumpy anyway.  Has been tired, fatigued lately- felt this way prior to having ablation for SVT. Hasn't seen cardiologist in awhile, can't remember his name.  Also reports feeling depressed for the last year or so, worsening in the past 60mths, and much worse the past 58mths. 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 Denies SI/HI. But sometimes feels it would be better and things would finally improve if she were to die. States she would never harm herself. But has lately come to the conclusion she needs help.  Sleeping ok, takes Tylenol PM to help her fall asleep, wakes in middle of night and hard to fall back asleep. Appetite has increased, feels she is emotional eating. Does not still find joy in things she used to.  Depression screen [facility] 2/9 03/09/2018 03/09/2018 12/20/2017 06/17/2017 06/15/2016  Decreased Interest 3 3 0 0 0  Down, Depressed, Hopeless 3 3 0 0 0  PHQ - 2 Score 6 6 0 0 0  Altered sleeping 2 2 - - -  Tired, decreased energy 3 3 - - -  Change in appetite 3 3 - - -  Feeling bad or failure about yourself  3 3 - - -  Trouble concentrating 3 3 - - -  Moving slowly or fidgety/restless 2 2 - - -  Suicidal thoughts 3 2 - - -  PHQ-9 Score 25 24 - - -  Difficult doing work/chores Extremely dIfficult Extremely dIfficult - - -     No LMP recorded. Patient is postmenopausal. The current method of family planning is tubal ligation. Last pap 10/17/15. Results were:  normal Review of  Systems:   Pertinent items are noted in HPI Denies fever/chills, dizziness, headaches, visual disturbances, fatigue, shortness of breath, chest pain, abdominal pain, vomiting, abnormal vaginal discharge/itching/odor/irritation, problems with periods, bowel movements, urination, or intercourse unless otherwise stated above.  Pertinent History Reviewed:  Reviewed past medical,surgical, social, obstetrical and family history.  Reviewed problem list, medications and allergies. Physical Assessment:   Vitals:   03/09/18 0908  BP: 133/79  Pulse: 73  Weight: 180 lb (81.6 kg)  Height: 5\' 9"  (1.753 m)  Body mass index is 26.58 kg/m.       Physical Examination:   General appearance: alert, well appearing, and in no distress  Mental status: alert, oriented to person, place, and time  Skin: warm & dry   Cardiovascular: normal heart rate noted  Respiratory: normal respiratory effort, no distress  Breasts - Rt breast normal; Lt breast thickening at 11 o'clock, indentation at 10 o'clock, pain at 9 o'clock adjacent to areola  Abdomen: soft, non-tender   Pelvic: examination not indicated  Extremities: no edema   No results found for this or any previous visit (from the past 24 hour(s)).  Assessment & Plan:  1) Lt breast pain> diagnostic mammo and bilat u/[facility] [place] at 11:10, be there at 10:50, no lotion/powder/deoderant/perfume  2) Fatigue> check labs, could be r/t depression. Gave her name of her cardiologist and number- can call to make appt w/ him for f/u  3) Depression> rx zoloft 25mg  daily, understands can take a few weeks to notice improvement. Gave number to [facility] in [place] [phone]. F/U in 4wks  Meds:  Meds ordered this encounter  Medications  . sertraline (ZOLOFT) 25 MG tablet    Sig: Take 1 tablet (25 mg total) by mouth daily.    Dispense:  30 tablet    Refill:  11    Order Specific Question:   Supervising Provider     Answer:   Tania Ade H [[id number]]    Orders Placed This Encounter  Procedures  . MM DIAG BREAST TOMO BILATERAL  . MM DIAG BREAST TOMO BILATERAL  . US BREAST LTD UNI LEFT INC AXILLA  . US BREAST LTD UNI RIGHT INC AXILLA  . CBC  . Comprehensive metabolic panel  . VITAMIN D 25 Hydroxy (Vit-D Deficiency, Fractures)    Return for cancel 1/8 appt, f/u in 4wks w/ me.  Roma Schanz CNM, [facility] 03/09/2018 2:34 PM

## 2018-03-10 LAB — COMPREHENSIVE METABOLIC PANEL
ALBUMIN: 4.8 g/dL (ref 3.6–4.8)
ALT: 18 IU/L (ref 0–32)
AST: 21 IU/L (ref 0–40)
Albumin/Globulin Ratio: 2.3 — ABNORMAL HIGH (ref 1.2–2.2)
Alkaline Phosphatase: 51 IU/L (ref 39–117)
BILIRUBIN TOTAL: 0.5 mg/dL (ref 0.0–1.2)
BUN / CREAT RATIO: 13 (ref 12–28)
BUN: 10 mg/dL (ref 8–27)
CALCIUM: 9.5 mg/dL (ref 8.7–10.3)
CO2: 25 mmol/L (ref 20–29)
CREATININE: 0.76 mg/dL (ref 0.57–1.00)
Chloride: 103 mmol/L (ref 96–106)
GFR, EST AFRICAN AMERICAN: 97 mL/min/{1.73_m2} (ref >59)
GFR, EST NON AFRICAN AMERICAN: 84 mL/min/{1.73_m2} (ref >59)
GLUCOSE: 85 mg/dL (ref 65–99)
Globulin, Total: 2.1 g/dL (ref 1.5–4.5)
Potassium: 4.1 mmol/L (ref 3.5–5.2)
Sodium: 144 mmol/L (ref 134–144)
TOTAL PROTEIN: 6.9 g/dL (ref 6.0–8.5)

## 2018-03-10 LAB — CBC
HEMATOCRIT: 41.5 % (ref 34.0–46.6)
HEMOGLOBIN: 14.3 g/dL (ref 11.1–15.9)
MCH: 32 pg (ref 26.6–33.0)
MCHC: 34.5 g/dL (ref 31.5–35.7)
MCV: 93 fL (ref 79–97)
Platelets: 188 10*3/uL (ref 150–450)
RBC: 4.47 x10E6/uL (ref 3.77–5.28)
RDW: 12.2 % — AB (ref 12.3–15.4)
WBC: 5.8 10*3/uL (ref 3.4–10.8)

## 2018-03-10 LAB — VITAMIN D 25 HYDROXY (VIT D DEFICIENCY, FRACTURES): Vit D, 25-Hydroxy: 40.5 ng/mL (ref 30.0–100.0)

## 2018-03-16 ENCOUNTER — Ambulatory Visit
Admission: RE | Admit: 2018-03-16 | Discharge: 2018-03-16 | Disposition: A | Discharge status: Home / Self Care | Payer: PRIVATE HEALTH INSURANCE | Source: Ambulatory Visit | Attending: Women's Health | Admitting: Women's Health

## 2018-03-16 ENCOUNTER — Other Ambulatory Visit: Payer: PRIVATE HEALTH INSURANCE

## 2018-03-16 DIAGNOSIS — N644 Mastodynia: Secondary | ICD-10-CM

## 2018-03-29 ENCOUNTER — Encounter (HOSPITAL_COMMUNITY): Payer: PRIVATE HEALTH INSURANCE

## 2018-04-11 ENCOUNTER — Ambulatory Visit: Payer: PRIVATE HEALTH INSURANCE | Admitting: Women's Health

## 2018-04-11 ENCOUNTER — Encounter: Payer: Self-pay | Admitting: Women's Health

## 2018-04-11 VITALS — BP 115/84 | HR 82 | Ht 69.2 in | Wt 183.2 lb

## 2018-04-11 DIAGNOSIS — F329 Major depressive disorder, single episode, unspecified: Secondary | ICD-10-CM

## 2018-04-11 DIAGNOSIS — F32A Depression, unspecified: Secondary | ICD-10-CM

## 2018-04-11 NOTE — Progress Notes (Signed)
   GYN VISIT Patient name: Colleen Torres MRN [id number]  Date of birth: Feb 12, 1956 Chief Complaint:   Follow-up (Zoloft/ breast pain/ had u/s lt breast)  History of Present Illness:   Colleen Torres is a 62 y.o. G58P1 Caucasian female being seen today for f/u on zoloft 25mg  rx'd 03/09/18 for depression. She states she is feeling better, however usually has ups and downs and can't tell if this is just one of her up times or if medication is working. Tried to call [employer] counseling, didn't get anyone, left message, hasn't heard back from them. Plans to make sure changes in her life to decrease stress level which she thinks will definitely help.  Had breast mammogram/sono for breast pain, were both normal, no signs malignancy, pain has improved.   Depression screen [facility] 2/9 04/11/2018 03/09/2018 03/09/2018 12/20/2017 06/17/2017  Decreased Interest 1 3 3  0 0  Down, Depressed, Hopeless 1 3 3  0 0  PHQ - 2 Score 2 6 6  0 0  Altered sleeping 1 2 2  - -  Tired, decreased energy 3 3 3  - -  Change in appetite 2 3 3  - -  Feeling bad or failure about yourself  1 3 3  - -  Trouble concentrating 3 3 3  - -  Moving slowly or fidgety/restless 1 2 2  - -  Suicidal thoughts 0 3 2 - -  PHQ-9 Score 13 25 24  - -  Difficult doing work/chores Somewhat difficult Extremely dIfficult Extremely dIfficult - -     No LMP recorded. Patient is postmenopausal. The current method of family planning is post menopausal status. Last pap 10/17/15. Results were:  normal Review of Systems:   Pertinent items are noted in HPI Denies fever/chills, dizziness, headaches, visual disturbances, fatigue, shortness of breath, chest pain, abdominal pain, vomiting, abnormal vaginal discharge/itching/odor/irritation, problems with periods, bowel movements, urination, or intercourse unless otherwise stated above.  Pertinent History Reviewed:  Reviewed past medical,surgical, social, obstetrical and family history.  Reviewed problem list,  medications and allergies. Physical Assessment:   Vitals:   04/11/18 0837  BP: 115/84  Pulse: 82  Weight: 183 lb 3.2 oz (83.1 kg)  Height: 5' 9.2" (1.758 m)  Body mass index is 26.9 kg/m.       Physical Examination:   General appearance: alert, well appearing, and in no distress  Mental status: alert, oriented to person, place, and time  Skin: warm & dry   Cardiovascular: normal heart rate noted  Respiratory: normal respiratory effort, no distress  Abdomen: soft, non-tender   Pelvic: examination not indicated  Extremities: no edema   No results found for this or any previous visit (from the past 24 hour(s)).  Assessment & Plan:  1) Depression> currently improved on zoloft 25mg , pt unsure if is from medicine or just one of her 'up times' as it fluctuates. To let us know if feels like was just an 'up time' and needs meds increased. Will have nurse try to contact [facility] counseling, pt states she will also continue trying to contact. If unsuccessful will refer somewhere else.   2) Improved breast pain/normal mammo/sono  Meds: No orders of the defined types were placed in this encounter.   No orders of the defined types were placed in this encounter.   Return in about 6 months (around 10/11/2018) for Pap & physical.  [place], [facility] 04/11/2018 10:41 AM

## 2018-04-20 ENCOUNTER — Other Ambulatory Visit: Payer: PRIVATE HEALTH INSURANCE | Admitting: Adult Health

## 2018-06-13 LAB — TSH: TSH: 0.04 mIU/L — ABNORMAL LOW (ref 0.40–4.50)

## 2018-06-13 LAB — T4, FREE: Free T4: 1.6 ng/dL (ref 0.8–1.8)

## 2018-06-20 ENCOUNTER — Encounter: Payer: Self-pay | Admitting: "Endocrinology

## 2018-06-20 ENCOUNTER — Ambulatory Visit (INDEPENDENT_AMBULATORY_CARE_PROVIDER_SITE_OTHER): Payer: PRIVATE HEALTH INSURANCE | Admitting: "Endocrinology

## 2018-06-20 VITALS — BP 110/78 | HR 75 | Ht 69.0 in | Wt 183.0 lb

## 2018-06-20 DIAGNOSIS — M8589 Other specified disorders of bone density and structure, multiple sites: Secondary | ICD-10-CM

## 2018-06-20 DIAGNOSIS — E890 Postprocedural hypothyroidism: Secondary | ICD-10-CM

## 2018-06-20 MED ORDER — LEVOTHYROXINE SODIUM 112 MCG PO TABS: 112.0000 ug | ORAL_TABLET | Freq: Every day | ORAL | 2 refills | Status: DC

## 2018-06-20 NOTE — Progress Notes (Signed)
Endocrinology follow-up note    Subjective:    Patient ID: Colleen Torres, female    DOB: 1955/10/15, PCP Sasser, Silvestre Moment, MD   Past Medical History:  Diagnosis Date  . Abdominal pain, RLQ 09/26/2015  . Arthritis    Bilateral Hands  . Cancer (HCC)    Follicular neoplasm of thyroid  . Depression   . Dysrhythmia   . GERD (gastroesophageal reflux disease)   . Hypothyroidism    Past Surgical History:  Procedure Laterality Date  . APPENDECTOMY    . CYST REMOVAL NECK    . SUPRAVENTRICULAR TACHYCARDIA ABLATION  09/2002  . THYROIDECTOMY Right 03/19/2014   Procedure: RIGHT THYROID LOBECTOMY;  Surgeon: Jamesetta So, MD;  Location: [facility];  Service: General;  Laterality: Right;  . THYROIDECTOMY, PARTIAL Left 2011  . TUBAL LIGATION      Family History  Problem Relation Age of Onset  . Depression Father   . Anxiety disorder Father   . Other Mother        hip replacement; Torres stone  . Depression Brother   . Anxiety disorder Brother   . Miscarriages / [place] Daughter   . Macular degeneration Maternal Grandmother   . Heart disease Maternal Grandmother    Social History   Socioeconomic History  . Marital status: Married    Spouse name: Not on file  . Number of children: 1  . Years of education: Not on file  . Highest education level: Not on file  Occupational History  . Not on file  Social Needs  . Financial resource strain: Not on file  . Food insecurity:    Worry: Not on file    Inability: Not on file  . Transportation needs:    Medical: Not on file    Non-medical: Not on file  Tobacco Use  . Smoking status: Never Smoker  . Smokeless tobacco: Never Used  Substance and Sexual Activity  . Alcohol use: No  . Drug use: No  . Sexual activity: Yes    Birth control/protection: Surgical    Comment: tubal  Lifestyle  . Physical activity:    Days per week: Not on file    Minutes per session: Not on file  . Stress: Not on file  Relationships  . Social  connections:    Talks on phone: Not on file    Gets together: Not on file    Attends religious service: Not on file    Active member of club or organization: Not on file    Attends meetings of clubs or organizations: Not on file    Relationship status: Not on file  Other Topics Concern  . Not on file  Social History Narrative  . Not on file   Outpatient Encounter Medications as of 06/20/2018  Medication Sig  . Ascorbic Acid (VITAMIN C PO) Take 1 tablet by mouth daily.  . B Complex-C (SUPER B COMPLEX PO) Take by mouth daily.  . calcium carbonate (OS-CAL - DOSED IN MG OF ELEMENTAL CALCIUM) 1250 (500 Ca) MG tablet TAKE ONE (1) TABLET THREE (3) TIMES EACH DAY  . Cholecalciferol (VITAMIN D PO) Take 1 tablet by mouth daily.  . Glucosamine-Chondroitin (MOVE FREE PO) Take 1 capsule by mouth daily.  Marland Kitchen levothyroxine (SYNTHROID, LEVOTHROID) 112 MCG tablet Take 1 tablet (112 mcg total) by mouth daily before breakfast.  . Multiple Vitamins-Minerals (EYE VITAMINS PO) Take 2 capsules by mouth daily.   . sertraline (ZOLOFT) 25 MG tablet Take  1 tablet (25 mg total) by mouth daily.  . vitamin E 1000 UNIT capsule Take 1,000 Units by mouth daily.  . [DISCONTINUED] levothyroxine (SYNTHROID, LEVOTHROID) 125 MCG tablet Take 1 tablet (125 mcg total) by mouth daily before breakfast.   No facility-administered encounter medications on file as of 06/20/2018.    ALLERGIES: No Known Allergies VACCINATION STATUS: Immunization History  Administered Date(s) Administered  . Influenza,inj,Quad PF,6+ Mos 03/20/2014    HPI  63 year old female with medical history as above.   She is here  to follow-up for postsurgical hypothyroidism . She is on levothyroxine 125 mcg p.o. daily before breakfast.    She reports compliance.  She is also on calcium supplement for hypocalcemia following her most recent thyroid surgery. She has had history of multinodular goiter , on follow-up ultrasound guided FNA which showed FLUS, and  underwent completion thyroidectomy with benign outcomes- December 2015.  She underwent her first  left hemithyroidectomy in 2000 for MNG, reportedly benign.  -She denies palpitations, heat intolerance, tremors.  - she denies dysphagia, SOB. -She has steady weight since last visit. -She underwent bone density study which showed osteopenia.  Review of Systems Constitutional: + steady weight since last visit , - fatigue, +subjective hyperthermia Eyes: no blurry vision, no xerophthalmia ENT: no sore throat, no nodules palpated in throat, no dysphagia/odynophagia, no hoarseness Musculoskeletal: no muscle/joint aches Skin: no rashes Neurological: + tremors Psychiatric: no depression, no anxiety  Objective:    BP 110/78   Pulse 75   Ht 5\' 9"  (1.753 m)   Wt 183 lb (83 kg)   BMI 27.02 kg/m   Wt Readings from Last 3 Encounters:  06/20/18 183 lb (83 kg)  04/11/18 183 lb 3.2 oz (83.1 kg)  03/09/18 180 lb (81.6 kg)    Physical Exam Constitutional: + Overweight for height, not in acute distress.    Eyes: PERRLA, EOMI, no exophthalmos ENT: moist mucous membranes, post  Thyroidectomy scar. no cervical lymphadenopathy  Gastrointestinal: abdomen soft, NT, ND, BS+ Musculoskeletal: no deformities, strength intact in all 4 Skin: moist, warm, no rashes Neurological: + tremor with outstretched hands   Recent Results (from the past 2160 hour(s))  T4, free     Status: None   Collection Time: 06/13/18  9:51 AM  Result Value Ref Range   Free T4 1.6 0.8 - 1.8 ng/dL  TSH     Status: Abnormal   Collection Time: 06/13/18  9:51 AM  Result Value Ref Range   TSH 0.04 (L) 0.40 - 4.50 mIU/L   On  02/26//2017 calcium 9 TSH 3.05, free T4 1 0.4 , Vitamin D 35  On 05/20/2015 PTH was 25 , calcium was 8.9   Bone density on July 12, 2017  [facility] Spine L1-L4 07/12/2017 61.9 Osteopenia -1.3 1.021 g/cm2 DualFemur Neck Right 07/12/2017 61.9 Osteopenia -1.9 0.774 g/cm2 DualFemur Total Mean 07/12/2017 61.9  Osteopenia -1.3 0.850 g/cm2 ASSESSMENT: BMD as determined from Femur Neck Right is 0.774 g/cm2 with a T-Score of -1.9. This patient is considered osteopenic according to [facility]) criteria.   Assessment & Plan:   1. Postsurgical hypothyroidism She has history of multinodular goiter. She underwent remnant thyroidectomy on 03/19/14, no malignancy. Her previsit thyroid function tests are consistent with slight over replacement.  She is advised to lower her levothyroxine to 112 mcg p.o. daily before breakfast.     - We discussed about the correct intake of her thyroid hormone, on empty stomach at fasting, with water, separated by at  least 30 minutes from breakfast and other medications,  and separated by more than 4 hours from calcium, iron, multivitamins, acid reflux medications (PPIs). -Patient is made aware of the fact that thyroid hormone replacement is needed for life, dose to be adjusted by periodic monitoring of thyroid function tests.    2. Hypocalcemia -Her recent calcium is 8.8 mg/dL. -I suggested for her to continue calcium supplement,  calcium carbonate 1250 mg (elemental calcium of 500 mg)  to once a day.    3.  Osteopenia:  She has family history of osteoporosis.   -Her FRAX fracture risk calculation reveals 10-year probability of major osteoporosis-related fracture is 10%, while her risk for hip fracture is close to 0%.   -She will not need bisphosphonates therapy at this time.  I advised her to continue vitamin D and calcium supplements, and plan to repeat bone density in 1 years.    She is advised on combination exercise involving stretch, strength, and resistance components.   - I advised patient to maintain close follow up at [place]  for primary care needs. Follow up plan: Return in about 6 months (around 12/21/2018) for Follow up with Pre-visit Labs.  Glade Lloyd, MD Phone: [phone]  Fax: [phone]  -  This note was  partially dictated with voice recognition software. Similar sounding words can be transcribed inadequately or may not  be corrected upon review.  06/20/2018, 12:26 PM

## 2018-06-23 ENCOUNTER — Other Ambulatory Visit: Payer: Self-pay | Admitting: "Endocrinology

## 2018-12-14 DIAGNOSIS — E890 Postprocedural hypothyroidism: Secondary | ICD-10-CM | POA: Diagnosis not present

## 2018-12-15 LAB — COMPLETE METABOLIC PANEL WITH GFR
AG Ratio: 1.9 (calc) (ref 1.0–2.5)
ALT: 19 U/L (ref 6–29)
AST: 24 U/L (ref 10–35)
Albumin: 4.4 g/dL (ref 3.6–5.1)
Alkaline phosphatase (APISO): 42 U/L (ref 37–153)
BUN: 12 mg/dL (ref 7–25)
CO2: 31 mmol/L (ref 20–32)
Calcium: 8.7 mg/dL (ref 8.6–10.4)
Chloride: 103 mmol/L (ref 98–110)
Creat: 0.75 mg/dL (ref 0.50–0.99)
GFR, Est African American: 98 mL/min/{1.73_m2} (ref >=60)
GFR, Est Non African American: 85 mL/min/{1.73_m2} (ref >=60)
Globulin: 2.3 g/dL (calc) (ref 1.9–3.7)
Glucose, Bld: 90 mg/dL (ref 65–139)
Potassium: 4.2 mmol/L (ref 3.5–5.3)
Sodium: 141 mmol/L (ref 135–146)
Total Bilirubin: 0.6 mg/dL (ref 0.2–1.2)
Total Protein: 6.7 g/dL (ref 6.1–8.1)

## 2018-12-15 LAB — T4, FREE: Free T4: 1.2 ng/dL (ref 0.8–1.8)

## 2018-12-15 LAB — TSH: TSH: 0.24 mIU/L — ABNORMAL LOW (ref 0.40–4.50)

## 2018-12-20 ENCOUNTER — Encounter: Payer: Self-pay | Admitting: "Endocrinology

## 2018-12-20 ENCOUNTER — Ambulatory Visit (INDEPENDENT_AMBULATORY_CARE_PROVIDER_SITE_OTHER): Payer: PRIVATE HEALTH INSURANCE | Admitting: "Endocrinology

## 2018-12-20 ENCOUNTER — Other Ambulatory Visit: Payer: Self-pay

## 2018-12-20 DIAGNOSIS — R61 Generalized hyperhidrosis: Secondary | ICD-10-CM

## 2018-12-20 DIAGNOSIS — E890 Postprocedural hypothyroidism: Secondary | ICD-10-CM | POA: Diagnosis not present

## 2018-12-20 DIAGNOSIS — M8589 Other specified disorders of bone density and structure, multiple sites: Secondary | ICD-10-CM | POA: Diagnosis not present

## 2018-12-20 MED ORDER — LEVOTHYROXINE SODIUM 100 MCG PO TABS: 100.0000 ug | ORAL_TABLET | Freq: Every day | ORAL | 0 refills | Status: DC

## 2018-12-20 NOTE — Progress Notes (Signed)
12/20/2018                                Endocrinology Telehealth Visit Follow up Note -During COVID -19 Pandemic  I connected with Colleen Torres on 12/20/2018   by telephone and verified that I am speaking with the correct person using two identifiers. Colleen Torres, [birth year]. she has verbally consented to this visit. All issues noted in this document were discussed and addressed. The format was not optimal for physical exam.   Subjective:    Patient ID: Colleen Torres, female    DOB: Feb 11, 1956, PCP Sasser, Silvestre Moment, MD   Past Medical History:  Diagnosis Date  . Abdominal pain, RLQ 09/26/2015  . Arthritis    Bilateral Hands  . Cancer (HCC)    Follicular neoplasm of thyroid  . Depression   . Dysrhythmia   . GERD (gastroesophageal reflux disease)   . Hypothyroidism    Past Surgical History:  Procedure Laterality Date  . APPENDECTOMY    . CYST REMOVAL NECK    . SUPRAVENTRICULAR TACHYCARDIA ABLATION  09/2002  . THYROIDECTOMY Right 03/19/2014   Procedure: RIGHT THYROID LOBECTOMY;  Surgeon: Jamesetta So, MD;  Location: [facility];  Service: General;  Laterality: Right;  . THYROIDECTOMY, PARTIAL Left 2011  . TUBAL LIGATION      Family History  Problem Relation Age of Onset  . Depression Father   . Anxiety disorder Father   . Other Mother        hip replacement; Torres stone  . Depression Brother   . Anxiety disorder Brother   . Miscarriages / [place] Daughter   . Macular degeneration Maternal Grandmother   . Heart disease Maternal Grandmother    Social History   Socioeconomic History  . Marital status: Married    Spouse name: Not on file  . Number of children: 1  . Years of education: Not on file  . Highest education level: Not on file  Occupational History  . Not on file  Social Needs  . Financial resource strain: Not on file  . Food insecurity    Worry: Not on file    Inability: Not on file  . Transportation needs    Medical: Not on file    Non-medical:  Not on file  Tobacco Use  . Smoking status: Never Smoker  . Smokeless tobacco: Never Used  Substance and Sexual Activity  . Alcohol use: No  . Drug use: No  . Sexual activity: Yes    Birth control/protection: Surgical    Comment: tubal  Lifestyle  . Physical activity    Days per week: Not on file    Minutes per session: Not on file  . Stress: Not on file  Relationships  . Social Herbalist on phone: Not on file    Gets together: Not on file    Attends religious service: Not on file    Active member of club or organization: Not on file    Attends meetings of clubs or organizations: Not on file    Relationship status: Not on file  Other Topics Concern  . Not on file  Social History Narrative  . Not on file   Outpatient Encounter Medications as of 12/20/2018  Medication Sig  . Ascorbic Acid (VITAMIN C PO) Take 1 tablet by mouth daily.  . B Complex-C (SUPER B COMPLEX PO) Take by mouth  daily.  . calcium carbonate (OS-CAL - DOSED IN MG OF ELEMENTAL CALCIUM) 1250 (500 Ca) MG tablet TAKE ONE (1) TABLET THREE (3) TIMES EACH DAY  . Cholecalciferol (VITAMIN D PO) Take 1 tablet by mouth daily.  . Glucosamine-Chondroitin (MOVE FREE PO) Take 1 capsule by mouth daily.  Marland Kitchen levothyroxine (SYNTHROID) 100 MCG tablet Take 1 tablet (100 mcg total) by mouth daily before breakfast.  . Multiple Vitamins-Minerals (EYE VITAMINS PO) Take 2 capsules by mouth daily.   . sertraline (ZOLOFT) 25 MG tablet Take 1 tablet (25 mg total) by mouth daily.  . vitamin E 1000 UNIT capsule Take 1,000 Units by mouth daily.  . [DISCONTINUED] levothyroxine (SYNTHROID, LEVOTHROID) 112 MCG tablet TAKE ONE TABLET (112MCG TOTAL) BY MOUTH DAILY BEFORE BREAKFAST   No facility-administered encounter medications on file as of 12/20/2018.    ALLERGIES: No Known Allergies VACCINATION STATUS: Immunization History  Administered Date(s) Administered  . Influenza,inj,Quad PF,6+ Mos 03/20/2014    HPI  63 year old  female with medical history as above.   She is being engaged in telehealth via telephone for follow-up for postsurgical hypothyroidism . She is on levothyroxine 112 mcg p.o. daily before breakfast.    She reports compliance.  She is also on calcium supplement once a day for hypocalcemia following her most recent thyroid surgery. She has had history of multinodular goiter , on follow-up ultrasound guided FNA which showed FLUS, and underwent completion thyroidectomy with benign outcomes- December 2015.  She underwent her first  left hemithyroidectomy in 2000 for MNG, reportedly benign.  -She reports sweating, happening randomly more at night.  She denies palpitations, hot flashes, tremors.    - she denies dysphagia, SOB. -She has steady weight since last visit. -She underwent bone density study which showed osteopenia.  Review of Systems Limited as above.  Objective:    There were no vitals taken for this visit.  Wt Readings from Last 3 Encounters:  06/20/18 183 lb (83 kg)  04/11/18 183 lb 3.2 oz (83.1 kg)  03/09/18 180 lb (81.6 kg)       Recent Results (from the past 2160 hour(s))  TSH     Status: Abnormal   Collection Time: 12/14/18  8:50 AM  Result Value Ref Range   TSH 0.24 (L) 0.40 - 4.50 mIU/L  T4, free     Status: None   Collection Time: 12/14/18  8:50 AM  Result Value Ref Range   Free T4 1.2 0.8 - 1.8 ng/dL  COMPLETE METABOLIC PANEL WITH GFR     Status: None   Collection Time: 12/14/18  8:50 AM  Result Value Ref Range   Glucose, Bld 90 65 - 139 mg/dL    Comment: .        Non-fasting reference interval .    BUN 12 7 - 25 mg/dL   Creat 0.75 0.50 - 0.99 mg/dL    Comment: For patients >103 years of age, the reference limit for Creatinine is approximately 13% higher for people identified as African-American. .    GFR, Est Non African American 85 > OR = 60 mL/min/1.50m2   GFR, Est African American 98 > OR = 60 mL/min/1.32m2   BUN/Creatinine Ratio NOT APPLICABLE 6 - 22  (calc)   Sodium 141 135 - 146 mmol/L   Potassium 4.2 3.5 - 5.3 mmol/L   Chloride 103 98 - 110 mmol/L   CO2 31 20 - 32 mmol/L   Calcium 8.7 8.6 - 10.4 mg/dL   Total Protein 6.7 6.1 -  8.1 g/dL   Albumin 4.4 3.6 - 5.1 g/dL   Globulin 2.3 1.9 - 3.7 g/dL (calc)   AG Ratio 1.9 1.0 - 2.5 (calc)   Total Bilirubin 0.6 0.2 - 1.2 mg/dL   Alkaline phosphatase (APISO) 42 37 - 153 U/L   AST 24 10 - 35 U/L   ALT 19 6 - 29 U/L   On  02/26//2017 calcium 9 TSH 3.05, free T4 1 0.4 , Vitamin D 35  On 05/20/2015 PTH was 25 , calcium was 8.9   Bone density on July 12, 2017  AP Spine L1-L4 07/12/2017 61.9 Osteopenia -1.3 1.021 g/cm2 DualFemur Neck Right 07/12/2017 61.9 Osteopenia -1.9 0.774 g/cm2 DualFemur Total Mean 07/12/2017 61.9 Osteopenia -1.3 0.850 g/cm2 ASSESSMENT: BMD as determined from Femur Neck Right is 0.774 g/cm2 with a T-Score of -1.9. This patient is considered osteopenic according to [facility]) criteria.   Assessment & Plan:   1. Postsurgical hypothyroidism She has history of multinodular goiter. She underwent remnant thyroidectomy on 03/19/14, no malignancy. Her previsit thyroid function tests are improving towards target, however given her complaint of excessive sweating, she is approached for lower dose of levothyroxine.  I discussed and prescribed levothyroxine 100 mcg p.o. daily before breakfast.      - We discussed about the correct intake of her thyroid hormone, on empty stomach at fasting, with water, separated by at least 30 minutes from breakfast and other medications,  and separated by more than 4 hours from calcium, iron, multivitamins, acid reflux medications (PPIs). -Patient is made aware of the fact that thyroid hormone replacement is needed for life, dose to be adjusted by periodic monitoring of thyroid function tests.  -Given her unexplained hyperhidrosis, she is offered screening with free plasma metanephrines.  2. Hypocalcemia -Her recent  calcium is 8.8 mg/dL. -I suggested for her to continue calcium supplement,  calcium carbonate 1250 mg (elemental calcium of 500 mg)  to once a day.    3.  Osteopenia:  She has family history of osteoporosis.   -Her FRAX fracture risk calculation reveals 10-year probability of major osteoporosis-related fracture is 10%, while her risk for hip fracture is close to 0%.   -She will not need bisphosphonates therapy at this time.  I advised her to continue vitamin D and calcium supplements, and plan to repeat bone density in April 2021.     She is advised on combination exercise involving stretch, strength, and resistance components.   - I advised patient to maintain close follow up at [place]  for primary care needs.   Time for this visit: 15 minutes. Colleen Torres  participated in the discussions, expressed understanding, and voiced agreement with the above plans.  All questions were answered to her satisfaction. she is encouraged to contact clinic should she have any questions or concerns prior to her return visit.  Follow up plan: Return in about 9 weeks (around 02/21/2019) for Follow up with Pre-visit Labs.  Glade Lloyd, MD Phone: [phone]  Fax: [phone]  -  This note was partially dictated with voice recognition software. Similar sounding words can be transcribed inadequately or may not  be corrected upon review.  12/20/2018, 3:47 PM

## 2018-12-21 ENCOUNTER — Ambulatory Visit: Payer: PRIVATE HEALTH INSURANCE | Admitting: "Endocrinology

## 2019-01-18 DIAGNOSIS — Z23 Encounter for immunization: Secondary | ICD-10-CM | POA: Diagnosis not present

## 2019-02-15 DIAGNOSIS — E890 Postprocedural hypothyroidism: Secondary | ICD-10-CM | POA: Diagnosis not present

## 2019-02-15 DIAGNOSIS — R61 Generalized hyperhidrosis: Secondary | ICD-10-CM | POA: Diagnosis not present

## 2019-02-18 LAB — METANEPHRINES, PLASMA
Metanephrine, Free: <25 pg/mL (ref <=57)
Normetanephrine, Free: 82 pg/mL (ref <=148)
Total Metanephrines-Plasma: 82 pg/mL (ref <=205)

## 2019-02-18 LAB — TSH: TSH: 1.22 mIU/L (ref 0.40–4.50)

## 2019-02-18 LAB — T4, FREE: Free T4: 1.2 ng/dL (ref 0.8–1.8)

## 2019-02-22 ENCOUNTER — Other Ambulatory Visit: Payer: Self-pay

## 2019-02-22 ENCOUNTER — Encounter: Payer: Self-pay | Admitting: "Endocrinology

## 2019-02-22 ENCOUNTER — Ambulatory Visit (INDEPENDENT_AMBULATORY_CARE_PROVIDER_SITE_OTHER): Payer: BC Managed Care – PPO | Admitting: "Endocrinology

## 2019-02-22 DIAGNOSIS — E890 Postprocedural hypothyroidism: Secondary | ICD-10-CM

## 2019-02-22 NOTE — Progress Notes (Signed)
02/22/2019                                Endocrinology Telehealth Visit Follow up Note -During COVID -19 Pandemic  I connected with Colleen Torres on 02/22/2019   by telephone and verified that I am speaking with the correct person using two identifiers. Colleen Torres, 04-02-1956. she has verbally consented to this visit. All issues noted in this document were discussed and addressed. The format was not optimal for physical exam.  Subjective:    Patient ID: Colleen Torres, female    DOB: February 16, 1956, PCP Sasser, Silvestre Moment, MD   Past Medical History:  Diagnosis Date  . Abdominal pain, RLQ 09/26/2015  . Arthritis    Bilateral Hands  . Cancer (HCC)    Follicular neoplasm of thyroid  . Depression   . Dysrhythmia   . GERD (gastroesophageal reflux disease)   . Hypothyroidism    Past Surgical History:  Procedure Laterality Date  . APPENDECTOMY    . CYST REMOVAL NECK    . SUPRAVENTRICULAR TACHYCARDIA ABLATION  09/2002  . THYROIDECTOMY Right 03/19/2014   Procedure: RIGHT THYROID LOBECTOMY;  Surgeon: Jamesetta So, MD;  Location: [facility];  Service: General;  Laterality: Right;  . THYROIDECTOMY, PARTIAL Left 2011  . TUBAL LIGATION      Family History  Problem Relation Age of Onset  . Depression Father   . Anxiety disorder Father   . Other Mother        hip replacement; Torres stone  . Depression Brother   . Anxiety disorder Brother   . Miscarriages / [place] Daughter   . Macular degeneration Maternal Grandmother   . Heart disease Maternal Grandmother    Social History   Socioeconomic History  . Marital status: Married    Spouse name: Not on file  . Number of children: 1  . Years of education: Not on file  . Highest education level: Not on file  Occupational History  . Not on file  Social Needs  . Financial resource strain: Not on file  . Food insecurity    Worry: Not on file    Inability: Not on file  . Transportation needs    Medical: Not on file    Non-medical:  Not on file  Tobacco Use  . Smoking status: Never Smoker  . Smokeless tobacco: Never Used  Substance and Sexual Activity  . Alcohol use: No  . Drug use: No  . Sexual activity: Yes    Birth control/protection: Surgical    Comment: tubal  Lifestyle  . Physical activity    Days per week: Not on file    Minutes per session: Not on file  . Stress: Not on file  Relationships  . Social Herbalist on phone: Not on file    Gets together: Not on file    Attends religious service: Not on file    Active member of club or organization: Not on file    Attends meetings of clubs or organizations: Not on file    Relationship status: Not on file  Other Topics Concern  . Not on file  Social History Narrative  . Not on file   Outpatient Encounter Medications as of 02/22/2019  Medication Sig  . Ascorbic Acid (VITAMIN C PO) Take 1 tablet by mouth daily.  . B Complex-C (SUPER B COMPLEX PO) Take by mouth daily.  Marland Kitchen  calcium carbonate (OS-CAL - DOSED IN MG OF ELEMENTAL CALCIUM) 1250 (500 Ca) MG tablet TAKE ONE (1) TABLET THREE (3) TIMES EACH DAY  . Cholecalciferol (VITAMIN D PO) Take 1 tablet by mouth daily.  . Glucosamine-Chondroitin (MOVE FREE PO) Take 1 capsule by mouth daily.  Marland Kitchen levothyroxine (SYNTHROID) 100 MCG tablet Take 1 tablet (100 mcg total) by mouth daily before breakfast.  . Multiple Vitamins-Minerals (EYE VITAMINS PO) Take 2 capsules by mouth daily.   . sertraline (ZOLOFT) 25 MG tablet Take 1 tablet (25 mg total) by mouth daily.  . vitamin E 1000 UNIT capsule Take 1,000 Units by mouth daily.   No facility-administered encounter medications on file as of 02/22/2019.    ALLERGIES: No Known Allergies VACCINATION STATUS: Immunization History  Administered Date(s) Administered  . Influenza,inj,Quad PF,6+ Mos 03/20/2014    HPI  63 year old female with medical history as above.   She is being engaged in telehealth via telephone for follow-up for postsurgical hypothyroidism .   She is currently on levothyroxine 100 mcg p.o. daily before breakfast.   She reports compliance.  She is also on calcium supplement once a day for hypocalcemia following her most recent thyroid surgery. She has had history of multinodular goiter , on follow-up ultrasound guided FNA which showed FLUS, and underwent completion thyroidectomy with benign outcomes- December 2015.  She underwent her first  left hemithyroidectomy in 2000 for MNG, reportedly benign.  -She reports sweating, happening randomly more at night.  She denies palpitations, hot flashes, tremors.    - she denies dysphagia, SOB. -She has steady weight since last visit. -She underwent bone density study which showed osteopenia.  Review of Systems Limited as above.  Objective:    There were no vitals taken for this visit.  Wt Readings from Last 3 Encounters:  06/20/18 183 lb (83 kg)  04/11/18 183 lb 3.2 oz (83.1 kg)  03/09/18 180 lb (81.6 kg)       Recent Results (from the past 2160 hour(s))  TSH     Status: Abnormal   Collection Time: 12/14/18  8:50 AM  Result Value Ref Range   TSH 0.24 (L) 0.40 - 4.50 mIU/L  T4, free     Status: None   Collection Time: 12/14/18  8:50 AM  Result Value Ref Range   Free T4 1.2 0.8 - 1.8 ng/dL  COMPLETE METABOLIC PANEL WITH GFR     Status: None   Collection Time: 12/14/18  8:50 AM  Result Value Ref Range   Glucose, Bld 90 65 - 139 mg/dL    Comment: .        Non-fasting reference interval .    BUN 12 7 - 25 mg/dL   Creat 0.75 0.50 - 0.99 mg/dL    Comment: For patients >24 years of age, the reference limit for Creatinine is approximately 13% higher for people identified as African-American. .    GFR, Est Non African American 85 > OR = 60 mL/min/1.68m2   GFR, Est African American 98 > OR = 60 mL/min/1.86m2   BUN/Creatinine Ratio NOT APPLICABLE 6 - 22 (calc)   Sodium 141 135 - 146 mmol/L   Potassium 4.2 3.5 - 5.3 mmol/L   Chloride 103 98 - 110 mmol/L   CO2 31 20 - 32  mmol/L   Calcium 8.7 8.6 - 10.4 mg/dL   Total Protein 6.7 6.1 - 8.1 g/dL   Albumin 4.4 3.6 - 5.1 g/dL   Globulin 2.3 1.9 - 3.7 g/dL (calc)  AG Ratio 1.9 1.0 - 2.5 (calc)   Total Bilirubin 0.6 0.2 - 1.2 mg/dL   Alkaline phosphatase (APISO) 42 37 - 153 U/L   AST 24 10 - 35 U/L   ALT 19 6 - 29 U/L  T4, free     Status: None   Collection Time: 02/15/19  8:47 AM  Result Value Ref Range   Free T4 1.2 0.8 - 1.8 ng/dL  TSH     Status: None   Collection Time: 02/15/19  8:47 AM  Result Value Ref Range   TSH 1.22 0.40 - 4.50 mIU/L  Metanephrines, plasma     Status: None   Collection Time: 02/15/19  8:47 AM  Result Value Ref Range   Metanephrine, Free <25 <=57 pg/mL    Comment: . This test was developed and its analytical performance characteristics have been determined by [place], [place]. It has not been cleared or approved by the U.S. Food and Drug Administration. This assay has been validated pursuant to the CLIA regulations and is used for clinical purposes. .    Normetanephrine, Free 82 <=148 pg/mL    Comment: . This test was developed and its analytical performance characteristics have been determined by [place], [place]. It has not been cleared or approved by the U.S. Food and Drug Administration. This assay has been validated pursuant to the CLIA regulations and is used for clinical purposes. .    Total Metanephrines-Plasma 82 <=205 pg/mL    Comment: . For additional information, please refer to http://education.questdiagnostics.com/faq/MetFractFree (This link is being provided for informational/educatio informational/educational purposes only.) . Elevations >4-fold upper reference range: strongly suggestive of a pheochromocytoma(1). . Elevations >1- 4-fold upper reference range: significant but not diagnostic, may be due to medications or stress. Suggest running 24 hr urine fractionated  metanephrines and/or serum Chromagranin A for confirmation. . Reference: . (1)Algeciras-Schimnich A et al, Plasma Chromogranin A or Urine Fractionated Metanephrines Follow-Up Testing Improves the Diagnostic Accuracy of Plasma Fractionated Metanephrines for Pheochromocytoma. The Journal of Clinical Endocrinology # Metabolism 93(1), 91-95, 2008. . . . This test was developed and its analytical performance characteristics have been determined by [place], [place]. It has not been cleared or a pproved by the U.S. Food and Drug Administration. This assay has been validated pursuant to the CLIA regulations and is used for clinical purposes. .    On  02/26//2017 calcium 9 TSH 3.05, free T4 1 0.4 , Vitamin D 35  On 05/20/2015 PTH was 25 , calcium was 8.9   Bone density on July 12, 2017  AP Spine L1-L4 07/12/2017 61.9 Osteopenia -1.3 1.021 g/cm2 DualFemur Neck Right 07/12/2017 61.9 Osteopenia -1.9 0.774 g/cm2 DualFemur Total Mean 07/12/2017 61.9 Osteopenia -1.3 0.850 g/cm2 ASSESSMENT: BMD as determined from Femur Neck Right is 0.774 g/cm2 with a T-Score of -1.9. This patient is considered osteopenic according to [facility]) criteria.   Assessment & Plan:   1. Postsurgical hypothyroidism She has history of multinodular goiter. She underwent remnant thyroidectomy on 03/19/14, no malignancy. Her previsit thyroid function tests are consistent with appropriate replacement.  She is advised to continue levothyroxine 100 mcg p.o. daily before breakfast.     - We discussed about the correct intake of her thyroid hormone, on empty stomach at fasting, with water, separated by at least 30 minutes from breakfast and other medications,  and separated by more than 4 hours from calcium, iron, multivitamins, acid reflux medications (PPIs). -  Patient is made aware of the fact that thyroid hormone replacement is needed for life, dose to be  adjusted by periodic monitoring of thyroid function tests. -Plasma metanephrines are within normal limits.  2. Hypocalcemia -Her recent calcium is 8.7 mg/dL. -I suggested for her to continue calcium supplement,  calcium carbonate 1250 mg (elemental calcium of 500 mg)  to once a day.    3.  Osteopenia:  She has family history of osteoporosis.   -Her FRAX fracture risk calculation reveals 10-year probability of major osteoporosis-related fracture is 10%, while her risk for hip fracture is close to 0%.   -She will not need bisphosphonates therapy at this time.  I advised her to continue vitamin D and calcium supplements, and plan to repeat bone density in April 2021.      - I advised patient to maintain close follow up at [place]  for primary care needs.   Time for this visit: 15 minutes. Colleen Torres  participated in the discussions, expressed understanding, and voiced agreement with the above plans.  All questions were answered to her satisfaction. she is encouraged to contact clinic should she have any questions or concerns prior to her return visit.   Follow up plan: Return in about 4 months (around 06/22/2019) for Follow up with Pre-visit Labs.  Glade Lloyd, MD Phone: [phone]  Fax: [phone]  -  This note was partially dictated with voice recognition software. Similar sounding words can be transcribed inadequately or may not  be corrected upon review.  02/22/2019, 2:13 PM

## 2019-02-28 ENCOUNTER — Other Ambulatory Visit: Payer: Self-pay | Admitting: Women's Health

## 2019-03-16 ENCOUNTER — Other Ambulatory Visit: Payer: Self-pay | Admitting: "Endocrinology

## 2019-03-28 ENCOUNTER — Other Ambulatory Visit: Payer: Self-pay

## 2019-03-28 ENCOUNTER — Ambulatory Visit (INDEPENDENT_AMBULATORY_CARE_PROVIDER_SITE_OTHER): Payer: BC Managed Care – PPO | Admitting: Women's Health

## 2019-03-28 ENCOUNTER — Encounter: Payer: Self-pay | Admitting: Women's Health

## 2019-03-28 ENCOUNTER — Other Ambulatory Visit (HOSPITAL_COMMUNITY)
Admission: RE | Admit: 2019-03-28 | Discharge: 2019-03-28 | Disposition: A | Discharge status: Home / Self Care | Payer: BC Managed Care – PPO | Source: Ambulatory Visit | Attending: Obstetrics & Gynecology | Admitting: Obstetrics & Gynecology

## 2019-03-28 VITALS — Ht 69.2 in | Wt 190.0 lb

## 2019-03-28 DIAGNOSIS — Z01419 Encounter for gynecological examination (general) (routine) without abnormal findings: Secondary | ICD-10-CM

## 2019-03-28 MED ORDER — SERTRALINE HCL 25 MG PO TABS: ORAL_TABLET | ORAL | 11 refills | Status: DC

## 2019-03-28 NOTE — Progress Notes (Signed)
   WELL-WOMAN EXAMINATION Patient name: Colleen Torres MRN [id number]  Date of birth: 09-Jun-1955 Chief Complaint:   Gynecologic Exam (pap/physcial)  History of Present Illness:   Colleen Torres is a 63 y.o. G67P1 Caucasian female being seen today for a routine well-woman exam.  Current complaints: none. Has had a great year! Feels much better w/ zoloft. Not really sexually active b/c of pain r/t vaginal dryness.   PCP: Sasser      does not desire labs No LMP recorded. Patient is postmenopausal. The current method of family planning is post menopausal status and tubal ligation.  Last pap 10/17/15. Results were: normal. H/O abnormal pap: No Last mammogram: Dec 2019. Results were: normal.  Last colonoscopy: ~59yrs ago. Results were: normal.  Review of Systems:   Pertinent items are noted in HPI Denies any headaches, blurred vision, fatigue, shortness of breath, chest pain, abdominal pain, abnormal vaginal discharge/itching/odor/irritation, problems with periods, bowel movements, urination, or intercourse unless otherwise stated above. Pertinent History Reviewed:  Reviewed past medical,surgical, social and family history.  Reviewed problem list, medications and allergies. Physical Assessment:   Vitals:   03/28/19 0926  Weight: 190 lb (86.2 kg)  Height: 5' 9.2" (1.758 m)  Body mass index is 27.9 kg/m.        Physical Examination:   General appearance - well appearing, and in no distress  Mental status - alert, oriented to person, place, and time  Psych:  She has a normal mood and affect  Skin - warm and dry, normal color, no suspicious lesions noted  Chest - effort normal, all lung fields clear to auscultation bilaterally  Heart - normal rate and regular rhythm  Neck:  midline trachea, no thyromegaly or nodules  Breasts - breasts appear normal, no suspicious masses, no skin or nipple changes or  axillary nodes  Abdomen - soft, nontender, nondistended, no masses or organomegaly  Pelvic  - VULVA: normal appearing vulva with no masses, tenderness or lesions  VAGINA: normal appearing vagina with normal color and discharge, no lesions  CERVIX: normal appearing cervix without discharge or lesions, no CMT  Thin prep pap is done w/ HR HPV cotesting  UTERUS: uterus is felt to be normal size, shape, consistency and nontender   ADNEXA: No adnexal masses or tenderness noted.  Extremities:  No swelling or varicosities noted  Chaperone: Peggy Dones    No results found for this or any previous visit (from the past 24 hour(s)).  Assessment & Plan:  1) Well-Woman Exam  2) Vaginal dryness> Luvena, astroglide  3) Depression> continue zoloft, refilled today  Labs/procedures today: pap  Mammogram-schedule screening mammo now  Colonoscopy- per GI, or sooner if problems  No orders of the defined types were placed in this encounter.   Meds:  Meds ordered this encounter  Medications  . sertraline (ZOLOFT) 25 MG tablet    Sig: TAKE ONE TABLET (25MG  TOTAL) BY MOUTH DAILY    Dispense:  30 tablet    Refill:  11    Order Specific Question:   Supervising Provider    Answer:   Florian Buff [[id number]]    Follow-up: Return in about 1 year (around 03/27/2020) for Physical.  [place], WHNP-BC 03/28/2019 1030

## 2019-03-29 LAB — CYTOLOGY - PAP
Comment: NEGATIVE
Diagnosis: NEGATIVE
High risk HPV: NEGATIVE

## 2019-04-13 ENCOUNTER — Other Ambulatory Visit (HOSPITAL_COMMUNITY): Payer: Self-pay | Admitting: Women's Health

## 2019-04-13 DIAGNOSIS — Z1231 Encounter for screening mammogram for malignant neoplasm of breast: Secondary | ICD-10-CM

## 2019-04-20 ENCOUNTER — Other Ambulatory Visit: Payer: Self-pay

## 2019-04-20 ENCOUNTER — Ambulatory Visit (HOSPITAL_COMMUNITY)
Admission: RE | Admit: 2019-04-20 | Discharge: 2019-04-20 | Disposition: A | Discharge status: Home / Self Care | Payer: Self-pay | Source: Ambulatory Visit | Attending: Women's Health | Admitting: Women's Health

## 2019-04-20 ENCOUNTER — Other Ambulatory Visit (HOSPITAL_COMMUNITY): Payer: Self-pay | Admitting: Women's Health

## 2019-04-20 DIAGNOSIS — R928 Other abnormal and inconclusive findings on diagnostic imaging of breast: Secondary | ICD-10-CM

## 2019-04-20 DIAGNOSIS — Z1231 Encounter for screening mammogram for malignant neoplasm of breast: Secondary | ICD-10-CM

## 2019-04-25 ENCOUNTER — Ambulatory Visit (HOSPITAL_COMMUNITY): Admission: RE | Admit: 2019-04-25 | Payer: 59 | Source: Ambulatory Visit

## 2019-04-25 ENCOUNTER — Ambulatory Visit (HOSPITAL_COMMUNITY)
Admission: RE | Admit: 2019-04-25 | Discharge: 2019-04-25 | Disposition: A | Discharge status: Home / Self Care | Payer: 59 | Source: Ambulatory Visit | Attending: Women's Health | Admitting: Women's Health

## 2019-04-25 ENCOUNTER — Other Ambulatory Visit: Payer: Self-pay

## 2019-04-25 DIAGNOSIS — R928 Other abnormal and inconclusive findings on diagnostic imaging of breast: Secondary | ICD-10-CM | POA: Insufficient documentation

## 2019-05-01 ENCOUNTER — Telehealth: Payer: Self-pay | Admitting: *Deleted

## 2019-05-01 ENCOUNTER — Other Ambulatory Visit: Payer: Self-pay | Admitting: Women's Health

## 2019-05-01 MED ORDER — SERTRALINE HCL 25 MG PO TABS
ORAL_TABLET | ORAL | 4 refills | Status: DC
Start: 2019-05-01 — End: 2020-06-05

## 2019-05-01 NOTE — Telephone Encounter (Signed)
OK will rx 90 zoloft

## 2019-05-01 NOTE — Telephone Encounter (Signed)
Pharmacy left message that patient wants 90 days supply of Sertraline, can you change the script please?

## 2019-05-01 NOTE — Addendum Note (Signed)
Addended by: Derrek Monaco A on: 05/01/2019 12:13 PM   Modules accepted: Orders

## 2019-06-13 ENCOUNTER — Other Ambulatory Visit: Payer: Self-pay

## 2019-06-13 ENCOUNTER — Other Ambulatory Visit: Payer: Self-pay | Admitting: "Endocrinology

## 2019-06-13 ENCOUNTER — Telehealth: Payer: Self-pay | Admitting: "Endocrinology

## 2019-06-13 NOTE — Telephone Encounter (Signed)
Patient left a VM that she has an upcoming appt this month and she said that Dr Dorris Fetch wanted her to have a Bone Density. She has called [facility] several times, they stated they have not received an order. It looks like the order was canceled in epic. Please advise and let pt know. Thanks

## 2019-06-13 NOTE — Telephone Encounter (Signed)
Faxed order for bone density study to [facility]. Pt made aware.

## 2019-06-17 LAB — COMPLETE METABOLIC PANEL WITH GFR
AG Ratio: 1.8 (calc) (ref 1.0–2.5)
ALT: 16 U/L (ref 6–29)
AST: 22 U/L (ref 10–35)
Albumin: 4.3 g/dL (ref 3.6–5.1)
Alkaline phosphatase (APISO): 42 U/L (ref 37–153)
BUN: 12 mg/dL (ref 7–25)
CO2: 30 mmol/L (ref 20–32)
Calcium: 9.4 mg/dL (ref 8.6–10.4)
Chloride: 104 mmol/L (ref 98–110)
Creat: 0.9 mg/dL (ref 0.50–0.99)
GFR, Est African American: 79 mL/min/{1.73_m2} (ref >=60)
GFR, Est Non African American: 68 mL/min/{1.73_m2} (ref >=60)
Globulin: 2.4 g/dL (calc) (ref 1.9–3.7)
Glucose, Bld: 90 mg/dL (ref 65–139)
Potassium: 4.2 mmol/L (ref 3.5–5.3)
Sodium: 141 mmol/L (ref 135–146)
Total Bilirubin: 0.5 mg/dL (ref 0.2–1.2)
Total Protein: 6.7 g/dL (ref 6.1–8.1)

## 2019-06-17 LAB — T4, FREE: Free T4: 1.2 ng/dL (ref 0.8–1.8)

## 2019-06-17 LAB — VITAMIN D 25 HYDROXY (VIT D DEFICIENCY, FRACTURES): Vit D, 25-Hydroxy: 51 ng/mL (ref 30–100)

## 2019-06-17 LAB — TSH: TSH: 2.11 mIU/L (ref 0.40–4.50)

## 2019-06-22 ENCOUNTER — Encounter: Payer: Self-pay | Admitting: "Endocrinology

## 2019-06-22 ENCOUNTER — Ambulatory Visit (INDEPENDENT_AMBULATORY_CARE_PROVIDER_SITE_OTHER): Payer: 59 | Admitting: "Endocrinology

## 2019-06-22 DIAGNOSIS — E890 Postprocedural hypothyroidism: Secondary | ICD-10-CM

## 2019-06-22 DIAGNOSIS — M8589 Other specified disorders of bone density and structure, multiple sites: Secondary | ICD-10-CM

## 2019-06-22 NOTE — Progress Notes (Signed)
06/22/2019                                              Endocrinology Telehealth Visit Follow up Note -During COVID -19 Pandemic  I connected with Colleen Torres on 06/22/2019   by telephone and verified that I am speaking with the correct person using two identifiers. Colleen Torres, [birth year]. she has verbally consented to this visit. All issues noted in this document were discussed and addressed. The format was not optimal for physical exam.   Subjective:    Patient ID: Colleen Torres, female    DOB: 23-Apr-1955, PCP Sasser, Silvestre Moment, MD   Past Medical History:  Diagnosis Date  . Abdominal pain, RLQ 09/26/2015  . Arthritis    Bilateral Hands  . Cancer (HCC)    Follicular neoplasm of thyroid  . Depression   . Dysrhythmia   . GERD (gastroesophageal reflux disease)   . Hypothyroidism    Past Surgical History:  Procedure Laterality Date  . APPENDECTOMY    . CYST REMOVAL NECK    . SUPRAVENTRICULAR TACHYCARDIA ABLATION  09/2002  . THYROIDECTOMY Right 03/19/2014   Procedure: RIGHT THYROID LOBECTOMY;  Surgeon: Jamesetta So, MD;  Location: [facility];  Service: General;  Laterality: Right;  . THYROIDECTOMY, PARTIAL Left 2011  . TUBAL LIGATION      Family History  Problem Relation Age of Onset  . Depression Father   . Anxiety disorder Father   . Other Mother        hip replacement; Torres stone  . Depression Brother   . Anxiety disorder Brother   . Miscarriages / [place] Daughter   . Macular degeneration Maternal Grandmother   . Heart disease Maternal Grandmother    Social History   Socioeconomic History  . Marital status: Married    Spouse name: Not on file  . Number of children: 1  . Years of education: Not on file  . Highest education level: Not on file  Occupational History  . Not on file  Tobacco Use  . Smoking status: Never Smoker  . Smokeless tobacco: Never Used  Substance and Sexual Activity  . Alcohol use: No  . Drug use: No  . Sexual activity: Yes     Birth control/protection: Surgical    Comment: tubal  Other Topics Concern  . Not on file  Social History Narrative  . Not on file   Social Determinants of Health   Financial Resource Strain:   . Difficulty of Paying Living Expenses:   Food Insecurity:   . Worried About Charity fundraiser in the Last Year:   . Arboriculturist in the Last Year:   Transportation Needs:   . Film/video editor (Medical):   Marland Kitchen Lack of Transportation (Non-Medical):   Physical Activity:   . Days of Exercise per Week:   . Minutes of Exercise per Session:   Stress:   . Feeling of Stress :   Social Connections:   . Frequency of Communication with Friends and Family:   . Frequency of Social Gatherings with Friends and Family:   . Attends Religious Services:   . Active Member of Clubs or Organizations:   . Attends Archivist Meetings:   Marland Kitchen Marital Status:    Outpatient Encounter Medications as of 06/22/2019  Medication Sig  .  Ascorbic Acid (VITAMIN C PO) Take 1 tablet by mouth daily.  . B Complex-C (SUPER B COMPLEX PO) Take by mouth daily.  . calcium carbonate (OS-CAL - DOSED IN MG OF ELEMENTAL CALCIUM) 1250 (500 Ca) MG tablet TAKE ONE (1) TABLET THREE (3) TIMES EACH DAY  . Cholecalciferol (VITAMIN D PO) Take 1 tablet by mouth daily.  . Glucosamine-Chondroitin (MOVE FREE PO) Take 1 capsule by mouth daily.  Marland Kitchen levothyroxine (SYNTHROID) 100 MCG tablet TAKE ONE TABLET (100MCG TOTAL) BY MOUTH DAILY BEFORE BREAKFAST  . Multiple Vitamins-Minerals (EYE VITAMINS PO) Take 2 capsules by mouth daily.   . sertraline (ZOLOFT) 25 MG tablet Take 1 daily  . vitamin E 1000 UNIT capsule Take 1,000 Units by mouth daily.   No facility-administered encounter medications on file as of 06/22/2019.   ALLERGIES: No Known Allergies VACCINATION STATUS: Immunization History  Administered Date(s) Administered  . Influenza,inj,Quad PF,6+ Mos 03/20/2014    HPI  64 year old female with medical history as above.    She is being engaged in telehealth via telephone for follow-up for postsurgical hypothyroidism, hypocalcemia, osteopenia.    -  She is currently on levothyroxine 100 mcg p.o. daily before breakfast.   She reports compliance.  She is also on calcium supplement once a day for hypocalcemia following her most recent thyroid surgery. She has had history of multinodular goiter , on follow-up ultrasound guided FNA which showed FLUS, and underwent completion thyroidectomy with benign outcomes- December 2015.  She underwent her first  left hemithyroidectomy in 2000 for MNG, reportedly benign.  -She denies any new complaints today.     She denies palpitations, hot flashes, tremors.    - she denies dysphagia, SOB. -She has steady weight since last visit. -She underwent bone density study which showed osteopenia.  Her bone density is scheduled to be done on July 17, 2019.  Review of Systems Limited as above.  Objective:    There were no vitals taken for this visit.  Wt Readings from Last 3 Encounters:  03/28/19 190 lb (86.2 kg)  06/20/18 183 lb (83 kg)  04/11/18 183 lb 3.2 oz (83.1 kg)       Recent Results (from the past 2160 hour(s))  Cytology - PAP( [facility])     Status: None   Collection Time: 03/28/19  9:33 AM  Result Value Ref Range   High risk HPV Negative    Adequacy      Satisfactory for evaluation. The presence or absence of an   Adequacy      endocervical/transformation zone component cannot be determined because   Adequacy of atrophy.    Diagnosis      - Negative for intraepithelial lesion or malignancy (NILM)   Comment Normal Reference Range HPV - Negative   T4, free [facility]AS     Status: None   Collection Time: 06/16/19 10:38 AM  Result Value Ref Range   Free T4 1.2 0.8 - 1.8 ng/dL  TSH [facility]     Status: None   Collection Time: 06/16/19 10:38 AM  Result Value Ref Range   TSH 2.11 0.40 - 4.50 mIU/L  COMPLETE METABOLIC PANEL WITH GFR     Status: None   Collection  Time: 06/16/19 10:38 AM  Result Value Ref Range   Glucose, Bld 90 65 - 139 mg/dL    Comment: .        Non-fasting reference interval .    BUN 12 7 - 25 mg/dL   Creat 0.90 0.50 -  0.99 mg/dL    Comment: For patients >70 years of age, the reference limit for Creatinine is approximately 13% higher for people identified as African-American. .    GFR, Est Non African American 68 > OR = 60 mL/min/1.42m2   GFR, Est African American 79 > OR = 60 mL/min/1.46m2   BUN/Creatinine Ratio NOT APPLICABLE 6 - 22 (calc)   Sodium 141 135 - 146 mmol/L   Potassium 4.2 3.5 - 5.3 mmol/L   Chloride 104 98 - 110 mmol/L   CO2 30 20 - 32 mmol/L   Calcium 9.4 8.6 - 10.4 mg/dL   Total Protein 6.7 6.1 - 8.1 g/dL   Albumin 4.3 3.6 - 5.1 g/dL   Globulin 2.4 1.9 - 3.7 g/dL (calc)   AG Ratio 1.8 1.0 - 2.5 (calc)   Total Bilirubin 0.5 0.2 - 1.2 mg/dL   Alkaline phosphatase (APISO) 42 37 - 153 U/L   AST 22 10 - 35 U/L   ALT 16 6 - 29 U/L  VITAMIN D 25 Hydroxy (Vit-D Deficiency, Fractures)     Status: None   Collection Time: 06/16/19 10:38 AM  Result Value Ref Range   Vit D, 25-Hydroxy 51 30 - 100 ng/mL    Comment: Vitamin D Status         25-OH Vitamin D: . Deficiency:                    <20 ng/mL Insufficiency:             20 - 29 ng/mL Optimal:                 > or = 30 ng/mL . For 25-OH Vitamin D testing on patients on  D2-supplementation and patients for whom quantitation  of D2 and D3 fractions is required, the [facility](TM) 25-OH VIT D, (D2,D3), LC/MS/MS is recommended: order  code [id number] (patients >9yrs). See Note 1 . Note 1 . For additional information, please refer to  http://education.[facility].com/faq/FAQ199  (This link is being provided for informational/ educational purposes only.)    On  02/26//2017 calcium 9 TSH 3.05, free T4 1 0.4 , Vitamin D 35  On 05/20/2015 PTH was 25 , calcium was 8.9   Bone density on July 12, 2017  AP Spine L1-L4 07/12/2017 61.9 Osteopenia -1.3  1.021 g/cm2 DualFemur Neck Right 07/12/2017 61.9 Osteopenia -1.9 0.774 g/cm2 DualFemur Total Mean 07/12/2017 61.9 Osteopenia -1.3 0.850 g/cm2 ASSESSMENT: BMD as determined from Femur Neck Right is 0.774 g/cm2 with a T-Score of -1.9. This patient is considered osteopenic according to [facility]) criteria.   Assessment & Plan:   1. Postsurgical hypothyroidism She has history of multinodular goiter. She underwent remnant thyroidectomy on 03/19/14, no malignancy. Her previsit thyroid function tests are consistent with appropriate replacement.  She is advised to continue continue levothyroxine 100 mcg p.o. daily before breakfast.     - We discussed about the correct intake of her thyroid hormone, on empty stomach at fasting, with water, separated by at least 30 minutes from breakfast and other medications,  and separated by more than 4 hours from calcium, iron, multivitamins, acid reflux medications (PPIs). -Patient is made aware of the fact that thyroid hormone replacement is needed for life, dose to be adjusted by periodic monitoring of thyroid function tests.  2. Hypocalcemia -Her recent calcium is 9.4 mg per DL.  We will continue to benefit from low-dose calcium supplement.   -I suggested for her to continue calcium  supplement,  calcium carbonate 1250 mg (elemental calcium of 500 mg)  to once a day.    3.  Osteopenia:  She has family history of osteoporosis.   -Her FRAX fracture risk calculation reveals 10-year probability of major osteoporosis-related fracture is 10%, while her risk for hip fracture is close to 0%.   -She will not need bisphosphonates therapy at this time.  I advised her to continue vitamin D and calcium supplements, and plan to repeat bone density in April 2021.   She will be contacted if her bone density is abnormal.   - I advised patient to maintain close follow up at [place]  for primary care needs.     - Time spent on this  patient care encounter:  25 minutes of which 50% was spent in  counseling and the rest reviewing  her current and  previous labs / studies and medications  doses and developing a plan for long term care. Colleen Torres  participated in the discussions, expressed understanding, and voiced agreement with the above plans.  All questions were answered to her satisfaction. she is encouraged to contact clinic should she have any questions or concerns prior to her return visit.   Follow up plan: Return in about 6 months (around 12/23/2019) for Follow up with Pre-visit Labs.  Glade Lloyd, MD Phone: [phone]  Fax: [phone]  -  This note was partially dictated with voice recognition software. Similar sounding words can be transcribed inadequately or may not  be corrected upon review.  06/22/2019, 5:38 PM

## 2019-07-17 ENCOUNTER — Other Ambulatory Visit: Payer: Self-pay

## 2019-07-17 ENCOUNTER — Ambulatory Visit (HOSPITAL_COMMUNITY)
Admission: RE | Admit: 2019-07-17 | Discharge: 2019-07-17 | Disposition: A | Discharge status: Home / Self Care | Payer: 59 | Source: Ambulatory Visit | Attending: "Endocrinology | Admitting: "Endocrinology

## 2019-09-12 ENCOUNTER — Other Ambulatory Visit: Payer: Self-pay | Admitting: "Endocrinology

## 2019-12-11 ENCOUNTER — Other Ambulatory Visit: Payer: Self-pay | Admitting: "Endocrinology

## 2019-12-21 LAB — COMPLETE METABOLIC PANEL WITH GFR
AG Ratio: 1.9 (calc) (ref 1.0–2.5)
ALT: 16 U/L (ref 6–29)
AST: 20 U/L (ref 10–35)
Albumin: 4.3 g/dL (ref 3.6–5.1)
Alkaline phosphatase (APISO): 46 U/L (ref 37–153)
BUN: 12 mg/dL (ref 7–25)
CO2: 30 mmol/L (ref 20–32)
Calcium: 8.6 mg/dL (ref 8.6–10.4)
Chloride: 102 mmol/L (ref 98–110)
Creat: 0.85 mg/dL (ref 0.50–0.99)
GFR, Est African American: 84 mL/min/{1.73_m2} (ref >=60)
GFR, Est Non African American: 72 mL/min/{1.73_m2} (ref >=60)
Globulin: 2.3 g/dL (calc) (ref 1.9–3.7)
Glucose, Bld: 89 mg/dL (ref 65–139)
Potassium: 4.3 mmol/L (ref 3.5–5.3)
Sodium: 138 mmol/L (ref 135–146)
Total Bilirubin: 0.5 mg/dL (ref 0.2–1.2)
Total Protein: 6.6 g/dL (ref 6.1–8.1)

## 2019-12-21 LAB — T4, FREE: Free T4: 1.2 ng/dL (ref 0.8–1.8)

## 2019-12-21 LAB — TSH: TSH: 3.44 mIU/L (ref 0.40–4.50)

## 2019-12-25 ENCOUNTER — Other Ambulatory Visit: Payer: Self-pay

## 2019-12-25 ENCOUNTER — Telehealth (INDEPENDENT_AMBULATORY_CARE_PROVIDER_SITE_OTHER): Payer: 59 | Admitting: "Endocrinology

## 2019-12-25 ENCOUNTER — Encounter: Payer: Self-pay | Admitting: "Endocrinology

## 2019-12-25 DIAGNOSIS — E890 Postprocedural hypothyroidism: Secondary | ICD-10-CM

## 2019-12-25 NOTE — Progress Notes (Signed)
12/25/2019                                    Endocrinology Telehealth Visit Follow up Note -During COVID -19 Pandemic  This visit type was conducted  Via telephone due to national recommendations for restrictions regarding the COVID-19 Pandemic  in an effort to limit this patient's exposure and mitigate transmission of the corona virus.   I connected with Colleen Torres on 12/25/2019   by telephone and verified that I am speaking with the correct person using two identifiers. Colleen Torres, 10-29-1955. she has verbally consented to this visit.  I was in my office and patient was in her residence. No other persons were with me during the encounter. All issues noted in this document were discussed and addressed. The format was not optimal for physical exam.  Subjective:    Patient ID: Colleen Torres, female    DOB: Apr 19, 1955, PCP Sasser, Silvestre Moment, MD   Past Medical History:  Diagnosis Date  . Abdominal pain, RLQ 09/26/2015  . Arthritis    Bilateral Hands  . Cancer (HCC)    Follicular neoplasm of thyroid  . Depression   . Dysrhythmia   . GERD (gastroesophageal reflux disease)   . Hypothyroidism    Past Surgical History:  Procedure Laterality Date  . APPENDECTOMY    . CYST REMOVAL NECK    . SUPRAVENTRICULAR TACHYCARDIA ABLATION  09/2002  . THYROIDECTOMY Right 03/19/2014   Procedure: RIGHT THYROID LOBECTOMY;  Surgeon: Jamesetta So, MD;  Location: [facility];  Service: General;  Laterality: Right;  . THYROIDECTOMY, PARTIAL Left 2011  . TUBAL LIGATION      Family History  Problem Relation Age of Onset  . Depression Father   . Anxiety disorder Father   . Other Mother        hip replacement; Torres stone  . Depression Brother   . Anxiety disorder Brother   . Miscarriages / [place] Daughter   . Macular degeneration Maternal Grandmother   . Heart disease Maternal Grandmother    Social History   Socioeconomic History  . Marital status: Married    Spouse name: Not on file  .  Number of children: 1  . Years of education: Not on file  . Highest education level: Not on file  Occupational History  . Not on file  Tobacco Use  . Smoking status: Never Smoker  . Smokeless tobacco: Never Used  Vaping Use  . Vaping Use: Never used  Substance and Sexual Activity  . Alcohol use: No  . Drug use: No  . Sexual activity: Yes    Birth control/protection: Surgical    Comment: tubal  Other Topics Concern  . Not on file  Social History Narrative  . Not on file   Social Determinants of Health   Financial Resource Strain:   . Difficulty of Paying Living Expenses: Not on file  Food Insecurity:   . Worried About Charity fundraiser in the Last Year: Not on file  . Ran Out of Food in the Last Year: Not on file  Transportation Needs:   . Lack of Transportation (Medical): Not on file  . Lack of Transportation (Non-Medical): Not on file  Physical Activity:   . Days of Exercise per Week: Not on file  . Minutes of Exercise per Session: Not on file  Stress:   . Feeling of  Stress : Not on file  Social Connections:   . Frequency of Communication with Friends and Family: Not on file  . Frequency of Social Gatherings with Friends and Family: Not on file  . Attends Religious Services: Not on file  . Active Member of Clubs or Organizations: Not on file  . Attends Archivist Meetings: Not on file  . Marital Status: Not on file   Outpatient Encounter Medications as of 12/25/2019  Medication Sig  . Ascorbic Acid (VITAMIN C PO) Take 1 tablet by mouth daily.  . B Complex-C (SUPER B COMPLEX PO) Take by mouth daily.  . calcium carbonate (OS-CAL - DOSED IN MG OF ELEMENTAL CALCIUM) 1250 (500 Ca) MG tablet TAKE ONE (1) TABLET THREE (3) TIMES EACH DAY  . Cholecalciferol (VITAMIN D PO) Take 1 tablet by mouth daily.  . Glucosamine-Chondroitin (MOVE FREE PO) Take 1 capsule by mouth daily.  Marland Kitchen levothyroxine (SYNTHROID) 100 MCG tablet TAKE ONE TABLET (100MCG TOTAL) BY MOUTH DAILY  BEFORE BREAKFAST  . Multiple Vitamins-Minerals (EYE VITAMINS PO) Take 2 capsules by mouth daily.   . sertraline (ZOLOFT) 25 MG tablet Take 1 daily  . vitamin E 1000 UNIT capsule Take 1,000 Units by mouth daily.   No facility-administered encounter medications on file as of 12/25/2019.   ALLERGIES: No Known Allergies VACCINATION STATUS: Immunization History  Administered Date(s) Administered  . Influenza,inj,Quad PF,6+ Mos 03/20/2014    HPI  64 year old female with medical history as above.   She is being engaged in telehealth via telephone for follow-up for postsurgical hypothyroidism, hypocalcemia, osteopenia.    -  She is currently on levothyroxine 100 mcg p.o. daily before breakfast.  She reports compliance.  She has no new complaints today.   -For mild postsurgical hypocalcemia, she is also on calcium supplement once a day . Her recent bone density was consistent with stable findings compared to 2019.  See below. She has had history of multinodular goiter , on follow-up ultrasound guided FNA which showed FLUS, and underwent completion thyroidectomy with benign outcomes- December 2015.  She underwent her first  left hemithyroidectomy in 2000 for MNG, reportedly benign.      - she denies dysphagia, SOB. -She has steady weight since last visit. -She underwent bone density study which showed osteopenia.  Her bone density is scheduled to be done on July 17, 2019.  Review of Systems Limited as above.  Objective:    There were no vitals taken for this visit.  Wt Readings from Last 3 Encounters:  03/28/19 190 lb (86.2 kg)  06/20/18 183 lb (83 kg)  04/11/18 183 lb 3.2 oz (83.1 kg)       Recent Results (from the past 2160 hour(s))  T4, free     Status: None   Collection Time: 12/20/19  8:10 AM  Result Value Ref Range   Free T4 1.2 0.8 - 1.8 ng/dL  TSH     Status: None   Collection Time: 12/20/19  8:10 AM  Result Value Ref Range   TSH 3.44 0.40 - 4.50 mIU/L  COMPLETE  METABOLIC PANEL WITH GFR     Status: None   Collection Time: 12/20/19  8:10 AM  Result Value Ref Range   Glucose, Bld 89 65 - 139 mg/dL    Comment: .        Non-fasting reference interval .    BUN 12 7 - 25 mg/dL   Creat 0.85 0.50 - 0.99 mg/dL    Comment: For patients >  59 years of age, the reference limit for Creatinine is approximately 13% higher for people identified as African-American. .    GFR, Est Non African American 72 > OR = 60 mL/min/1.55m2   GFR, Est African American 84 > OR = 60 mL/min/1.53m2   BUN/Creatinine Ratio NOT APPLICABLE 6 - 22 (calc)   Sodium 138 135 - 146 mmol/L   Potassium 4.3 3.5 - 5.3 mmol/L   Chloride 102 98 - 110 mmol/L   CO2 30 20 - 32 mmol/L   Calcium 8.6 8.6 - 10.4 mg/dL   Total Protein 6.6 6.1 - 8.1 g/dL   Albumin 4.3 3.6 - 5.1 g/dL   Globulin 2.3 1.9 - 3.7 g/dL (calc)   AG Ratio 1.9 1.0 - 2.5 (calc)   Total Bilirubin 0.5 0.2 - 1.2 mg/dL   Alkaline phosphatase (APISO) 46 37 - 153 U/L   AST 20 10 - 35 U/L   ALT 16 6 - 29 U/L   On  02/26//2017 calcium 9 TSH 3.05, free T4 1 0.4 , Vitamin D 35  On 05/20/2015 PTH was 25 , calcium was 8.9   Bone density on July 12, 2017  AP Spine L1-L4 07/12/2017 61.9 Osteopenia -1.3 1.021 g/cm2 DualFemur Neck Right 07/12/2017 61.9 Osteopenia -1.9 0.774 g/cm2 DualFemur Total Mean 07/12/2017 61.9 Osteopenia -1.3 0.850 g/cm2 ASSESSMENT: BMD as determined from Femur Neck Right is 0.774 g/cm2 with a T-Score of -1.9. This patient is considered osteopenic according to [facility]) criteria.   Bone density on July 17, 2019:  AP Spine L1-L4 07/17/2019 64.0 Osteopenia -1.4 1.008 g/cm2 -1.3% - AP Spine L1-L4 07/12/2017 61.9 Osteopenia -1.3 1.021 g/cm2 - -  DualFemur Neck Right 07/17/2019 64.0 Osteopenia -1.9 0.771 g/cm2 -0.4% - DualFemur Neck Right 07/12/2017 61.9 Osteopenia -1.9 0.774 g/cm2 - -  DualFemur Total Mean 07/17/2019 64.0 Osteopenia -1.2 0.853 g/cm2 0.4% - DualFemur Total Mean  07/12/2017 61.9 Osteopenia -1.3 0.850 g/cm2 - - ASSESSMENT: The BMD measured at Femur Neck Right is 0.771 g/cm2 with a T-score of -1.9. This patient is considered osteopenic according to [facility]) criteria. The scan quality is good. Compared with the prior study on 07/12/2017, the BMD of the lumbar spine, right femoral neck and total mean shows no statistically significant change.    Assessment & Plan:   1. Postsurgical hypothyroidism She has history of multinodular goiter. She underwent remnant thyroidectomy on 03/19/14, no malignancy. Her previsit thyroid function tests are consistent with appropriate replacement.  She is advised to continue levothyroxine 100 mcg p.o. daily before breakfast.    - We discussed about the correct intake of her thyroid hormone, on empty stomach at fasting, with water, separated by at least 30 minutes from breakfast and other medications,  and separated by more than 4 hours from calcium, iron, multivitamins, acid reflux medications (PPIs). -Patient is made aware of the fact that thyroid hormone replacement is needed for life, dose to be adjusted by periodic monitoring of thyroid function tests.   2. Hypocalcemia -Her recent calcium is 9.4 mg per DL.  We will continue to benefit from low-dose calcium supplement.   -I suggested for her to continue calcium supplement,  calcium carbonate 1250 mg (elemental calcium of 500 mg)  to once a day.    3.  Osteopenia:  She has family history of osteoporosis.   -Her FRAX fracture risk calculation reveals 10-year probability of major osteoporosis-related fracture is 10%, while her risk for hip fracture is close to 0%.   -  She will not need bisphosphonate therapy at this time.  She is advised to continue vitamin D and calcium supplements, and plan to repeat bone density in April 2021.      - I advised patient to maintain close follow up at [place]  for primary care needs.      -  Time spent on this patient care encounter:  20 minutes of which 50% was spent in  counseling and the rest reviewing  her current and  previous labs / studies and medications  doses and developing a plan for long term care. Colleen Torres  participated in the discussions, expressed understanding, and voiced agreement with the above plans.  All questions were answered to her satisfaction. she is encouraged to contact clinic should she have any questions or concerns prior to her return visit.    Follow up plan: Return in about 6 months (around 06/23/2020) for F/U with Pre-visit Labs.  Glade Lloyd, MD Phone: [phone]  Fax: [phone]  -  This note was partially dictated with voice recognition software. Similar sounding words can be transcribed inadequately or may not  be corrected upon review.  12/25/2019, 12:56 PM

## 2020-03-12 ENCOUNTER — Other Ambulatory Visit: Payer: Self-pay | Admitting: "Endocrinology

## 2020-06-05 ENCOUNTER — Other Ambulatory Visit: Payer: Self-pay | Admitting: Adult Health

## 2020-06-18 LAB — COMPREHENSIVE METABOLIC PANEL
ALT: 36 IU/L — ABNORMAL HIGH (ref 0–32)
AST: 31 IU/L (ref 0–40)
Albumin/Globulin Ratio: 2.3 — ABNORMAL HIGH (ref 1.2–2.2)
Albumin: 4.6 g/dL (ref 3.8–4.8)
Alkaline Phosphatase: 72 IU/L (ref 44–121)
BUN/Creatinine Ratio: 13 (ref 12–28)
BUN: 12 mg/dL (ref 8–27)
Bilirubin Total: 0.5 mg/dL (ref 0.0–1.2)
CO2: 23 mmol/L (ref 20–29)
Calcium: 9.1 mg/dL (ref 8.7–10.3)
Chloride: 101 mmol/L (ref 96–106)
Creatinine, Ser: 0.89 mg/dL (ref 0.57–1.00)
Globulin, Total: 2 g/dL (ref 1.5–4.5)
Glucose: 90 mg/dL (ref 65–99)
Potassium: 4.3 mmol/L (ref 3.5–5.2)
Sodium: 140 mmol/L (ref 134–144)
Total Protein: 6.6 g/dL (ref 6.0–8.5)
eGFR: 72 mL/min/{1.73_m2} (ref >59)

## 2020-06-18 LAB — T4, FREE: Free T4: 1.3 ng/dL (ref 0.82–1.77)

## 2020-06-18 LAB — TSH: TSH: 9.16 u[IU]/mL — ABNORMAL HIGH (ref 0.450–4.500)

## 2020-06-24 ENCOUNTER — Other Ambulatory Visit: Payer: Self-pay

## 2020-06-24 ENCOUNTER — Encounter: Payer: Self-pay | Admitting: "Endocrinology

## 2020-06-24 ENCOUNTER — Ambulatory Visit (INDEPENDENT_AMBULATORY_CARE_PROVIDER_SITE_OTHER): Payer: 59 | Admitting: "Endocrinology

## 2020-06-24 VITALS — BP 110/78 | HR 76 | Ht 69.5 in | Wt 198.4 lb

## 2020-06-24 DIAGNOSIS — M8589 Other specified disorders of bone density and structure, multiple sites: Secondary | ICD-10-CM

## 2020-06-24 DIAGNOSIS — E890 Postprocedural hypothyroidism: Secondary | ICD-10-CM | POA: Diagnosis not present

## 2020-06-24 MED ORDER — LEVOTHYROXINE SODIUM 112 MCG PO TABS: ORAL_TABLET | ORAL | 1 refills | Status: DC

## 2020-06-24 NOTE — Progress Notes (Signed)
06/24/2020        Endocrinology follow-up note   Subjective:    Patient ID: Colleen Torres, female    DOB: May 02, 1955, PCP Sasser, Silvestre Moment, MD   Past Medical History:  Diagnosis Date  . Abdominal pain, RLQ 09/26/2015  . Arthritis    Bilateral Hands  . Cancer (HCC)    Follicular neoplasm of thyroid  . Depression   . Dysrhythmia   . GERD (gastroesophageal reflux disease)   . Hypothyroidism    Past Surgical History:  Procedure Laterality Date  . APPENDECTOMY    . CYST REMOVAL NECK    . SUPRAVENTRICULAR TACHYCARDIA ABLATION  09/2002  . THYROIDECTOMY Right 03/19/2014   Procedure: RIGHT THYROID LOBECTOMY;  Surgeon: Jamesetta So, MD;  Location: [facility];  Service: General;  Laterality: Right;  . THYROIDECTOMY, PARTIAL Left 2011  . TUBAL LIGATION      Family History  Problem Relation Age of Onset  . Depression Father   . Anxiety disorder Father   . Other Mother        hip replacement; Torres stone  . Depression Brother   . Anxiety disorder Brother   . Miscarriages / [place] Daughter   . Macular degeneration Maternal Grandmother   . Heart disease Maternal Grandmother    Social History   Socioeconomic History  . Marital status: Married    Spouse name: Not on file  . Number of children: 1  . Years of education: Not on file  . Highest education level: Not on file  Occupational History  . Not on file  Tobacco Use  . Smoking status: Never Smoker  . Smokeless tobacco: Never Used  Vaping Use  . Vaping Use: Never used  Substance and Sexual Activity  . Alcohol use: No  . Drug use: No  . Sexual activity: Yes    Birth control/protection: Surgical    Comment: tubal  Other Topics Concern  . Not on file  Social History Narrative  . Not on file   Social Determinants of Health   Financial Resource Strain: Not on file  Food Insecurity: Not on file  Transportation Needs: Not on file  Physical Activity: Not on file  Stress: Not on file  Social Connections: Not on  file   Outpatient Encounter Medications as of 06/24/2020  Medication Sig  . Multiple Vitamins-Minerals (CENTRUM SILVER 50+WOMEN PO) Take 1 tablet by mouth 2 (two) times daily.  Marland Kitchen levothyroxine (SYNTHROID) 112 MCG tablet TAKE ONE TABLET (100MCG TOTAL) BY MOUTH DAILY BEFORE BREAKFAST  . Multiple Vitamins-Minerals (EYE VITAMINS PO) Take 2 capsules by mouth daily.   . sertraline (ZOLOFT) 25 MG tablet TAKE 1 TABLET BY MOUTH ONCE A DAY.  . [DISCONTINUED] Ascorbic Acid (VITAMIN C PO) Take 1 tablet by mouth daily.  . [DISCONTINUED] B Complex-C (SUPER B COMPLEX PO) Take by mouth daily.  . [DISCONTINUED] calcium carbonate (OS-CAL - DOSED IN MG OF ELEMENTAL CALCIUM) 1250 (500 Ca) MG tablet TAKE ONE (1) TABLET THREE (3) TIMES EACH DAY  . [DISCONTINUED] Cholecalciferol (VITAMIN D PO) Take 1 tablet by mouth daily.  . [DISCONTINUED] Glucosamine-Chondroitin (MOVE FREE PO) Take 1 capsule by mouth daily.  . [DISCONTINUED] levothyroxine (SYNTHROID) 100 MCG tablet TAKE ONE TABLET (100MCG TOTAL) BY MOUTH DAILY BEFORE BREAKFAST  . [DISCONTINUED] vitamin E 1000 UNIT capsule Take 1,000 Units by mouth daily.   No facility-administered encounter medications on file as of 06/24/2020.   ALLERGIES: No Known Allergies VACCINATION STATUS: Immunization History  Administered Date(s)  Administered  . Influenza,inj,Quad PF,6+ Mos 03/20/2014    HPI  65 year old female with medical history as above.   She is being seen in follow-up for postsurgical hypothyroidism, hypocalcemia, osteopenia.   -  She is currently on levothyroxine 100 mcg p.o. daily before breakfast.  She reports compliance.  She has no new complaints today.   -For mild postsurgical hypocalcemia, she has taken herself off of calcium supplements, remains on multivitamin daily supplements.   Her recent labs show normal calcium.  Thyroid function tests are consistent with under replacement.  She has gained some weight since last visit. Her recent bone density  was consistent with stable findings compared to 2019.  See below. She has had history of multinodular goiter , on follow-up ultrasound guided FNA which showed FLUS, and underwent completion thyroidectomy with benign outcomes- December 2015.  She underwent her first  left hemithyroidectomy in 2000 for MNG, reportedly benign.      - she denies dysphagia, SOB. -She has steady weight since last visit. -She underwent bone density study which showed osteopenia.  Her bone density is scheduled to be done on July 17, 2019.  Review of Systems Limited as above.  Objective:    BP 110/78   Pulse 76   Ht 5' 9.5" (1.765 m)   Wt 198 lb 6.4 oz (90 kg)   BMI 28.88 kg/m   Wt Readings from Last 3 Encounters:  06/24/20 198 lb 6.4 oz (90 kg)  03/28/19 190 lb (86.2 kg)  06/20/18 183 lb (83 kg)       Recent Results (from the past 2160 hour(s))  Comprehensive metabolic panel     Status: Abnormal   Collection Time: 06/17/20  8:53 AM  Result Value Ref Range   Glucose 90 65 - 99 mg/dL   BUN 12 8 - 27 mg/dL   Creatinine, Ser 0.89 0.57 - 1.00 mg/dL   eGFR 72 >59 mL/min/1.73    Comment: **In accordance with recommendations from the NKF-ASN Task force,**   [facility] has updated its eGFR calculation to the 2021 CKD-EPI   creatinine equation that estimates Torres function without a race   variable.    BUN/Creatinine Ratio 13 12 - 28   Sodium 140 134 - 144 mmol/L   Potassium 4.3 3.5 - 5.2 mmol/L   Chloride 101 96 - 106 mmol/L   CO2 23 20 - 29 mmol/L   Calcium 9.1 8.7 - 10.3 mg/dL   Total Protein 6.6 6.0 - 8.5 g/dL   Albumin 4.6 3.8 - 4.8 g/dL   Globulin, Total 2.0 1.5 - 4.5 g/dL   Albumin/Globulin Ratio 2.3 (H) 1.2 - 2.2   Bilirubin Total 0.5 0.0 - 1.2 mg/dL   Alkaline Phosphatase 72 44 - 121 IU/L   AST 31 0 - 40 IU/L   ALT 36 (H) 0 - 32 IU/L  T4, free     Status: None   Collection Time: 06/17/20  8:53 AM  Result Value Ref Range   Free T4 1.30 0.82 - 1.77 ng/dL  TSH     Status: Abnormal    Collection Time: 06/17/20  8:53 AM  Result Value Ref Range   TSH 9.160 (H) 0.450 - 4.500 uIU/mL   On  02/26//2017 calcium 9 TSH 3.05, free T4 1 0.4 , Vitamin D 35  On 05/20/2015 PTH was 25 , calcium was 8.9   Bone density on July 12, 2017  AP Spine L1-L4 07/12/2017 61.9 Osteopenia -1.3 1.021 g/cm2 DualFemur Neck Right 07/12/2017 61.9  Osteopenia -1.9 0.774 g/cm2 DualFemur Total Mean 07/12/2017 61.9 Osteopenia -1.3 0.850 g/cm2 ASSESSMENT: BMD as determined from Femur Neck Right is 0.774 g/cm2 with a T-Score of -1.9. This patient is considered osteopenic according to [facility]) criteria.   Bone density on July 17, 2019:  AP Spine L1-L4 07/17/2019 64.0 Osteopenia -1.4 1.008 g/cm2 -1.3% - AP Spine L1-L4 07/12/2017 61.9 Osteopenia -1.3 1.021 g/cm2 - -  DualFemur Neck Right 07/17/2019 64.0 Osteopenia -1.9 0.771 g/cm2 -0.4% - DualFemur Neck Right 07/12/2017 61.9 Osteopenia -1.9 0.774 g/cm2 - -  DualFemur Total Mean 07/17/2019 64.0 Osteopenia -1.2 0.853 g/cm2 0.4% - DualFemur Total Mean 07/12/2017 61.9 Osteopenia -1.3 0.850 g/cm2 - - ASSESSMENT: The BMD measured at Femur Neck Right is 0.771 g/cm2 with a T-score of -1.9. This patient is considered osteopenic according to [facility]) criteria. The scan quality is good. Compared with the prior study on 07/12/2017, the BMD of the lumbar spine, right femoral neck and total mean shows no statistically significant change.    Assessment & Plan:   1. Postsurgical hypothyroidism She has history of multinodular goiter. She underwent remnant thyroidectomy on 03/19/14, no malignancy. Her previsit thyroid function tests are consistent with inadequate replacement.  I discussed and increase her levothyroxine to 112 mcg p.o. daily before breakfast.     - We discussed about the correct intake of her thyroid hormone, on empty stomach at fasting, with water, separated by at least 30 minutes from breakfast  and other medications,  and separated by more than 4 hours from calcium, iron, multivitamins, acid reflux medications (PPIs). -Patient is made aware of the fact that thyroid hormone replacement is needed for life, dose to be adjusted by periodic monitoring of thyroid function tests.  2. Hypocalcemia -Her recent calcium is 9.1 mg per DL.  She is only on multivitamin supplements of calcium supplements.  She is advised to continue on same.      3.  Osteopenia:  She has family history of osteoporosis.   -Her FRAX fracture risk calculation reveals 10-year probability of major osteoporosis-related fracture is 10%, while her risk for hip fracture is close to 0%.   -She will not need bisphosphonate therapy at this time.  She is advised to continue vitamin D and calcium supplements,  repeat bone density in April 2021 was still consistent with osteopenia.  Her bone density will be repeated in April 2023.      - I advised patient to maintain close follow up at [facility]  for primary care needs.      - Time spent on this patient care encounter:  30 minutes of which 50% was spent in  counseling and the rest reviewing  her current and  previous labs / studies and medications  doses and developing a plan for long term care, and documenting this care. Colleen Torres  participated in the discussions, expressed understanding, and voiced agreement with the above plans.  All questions were answered to her satisfaction. she is encouraged to contact clinic should she have any questions or concerns prior to her return visit.    Follow up plan: Return in about 6 months (around 12/25/2020) for F/U with Pre-visit Labs.  Glade Lloyd, MD Phone: [phone]  Fax: [phone]  -  This note was partially dictated with voice recognition software. Similar sounding words can be transcribed inadequately or may not  be corrected upon review.  06/24/2020, 3:59 PM

## 2020-07-12 DIAGNOSIS — H353130 Nonexudative age-related macular degeneration, bilateral, stage unspecified: Secondary | ICD-10-CM | POA: Diagnosis not present

## 2020-08-25 ENCOUNTER — Other Ambulatory Visit: Payer: Self-pay | Admitting: "Endocrinology

## 2020-09-05 DIAGNOSIS — Z20828 Contact with and (suspected) exposure to other viral communicable diseases: Secondary | ICD-10-CM | POA: Diagnosis not present

## 2020-09-26 DIAGNOSIS — Z6829 Body mass index (BMI) 29.0-29.9, adult: Secondary | ICD-10-CM | POA: Diagnosis not present

## 2020-09-26 DIAGNOSIS — M25531 Pain in right wrist: Secondary | ICD-10-CM | POA: Diagnosis not present

## 2020-10-03 DIAGNOSIS — M79643 Pain in unspecified hand: Secondary | ICD-10-CM | POA: Diagnosis not present

## 2020-10-03 DIAGNOSIS — M25431 Effusion, right wrist: Secondary | ICD-10-CM | POA: Diagnosis not present

## 2020-10-03 DIAGNOSIS — M898X3 Other specified disorders of bone, forearm: Secondary | ICD-10-CM | POA: Diagnosis not present

## 2020-10-03 DIAGNOSIS — M25531 Pain in right wrist: Secondary | ICD-10-CM | POA: Diagnosis not present

## 2020-10-03 DIAGNOSIS — S6991XA Unspecified injury of right wrist, hand and finger(s), initial encounter: Secondary | ICD-10-CM | POA: Diagnosis not present

## 2020-10-10 DIAGNOSIS — M25531 Pain in right wrist: Secondary | ICD-10-CM | POA: Diagnosis not present

## 2020-10-10 DIAGNOSIS — M79643 Pain in unspecified hand: Secondary | ICD-10-CM | POA: Diagnosis not present

## 2020-10-10 DIAGNOSIS — M25431 Effusion, right wrist: Secondary | ICD-10-CM | POA: Diagnosis not present

## 2020-10-10 DIAGNOSIS — M7989 Other specified soft tissue disorders: Secondary | ICD-10-CM | POA: Diagnosis not present

## 2020-10-10 DIAGNOSIS — S52501A Unspecified fracture of the lower end of right radius, initial encounter for closed fracture: Secondary | ICD-10-CM | POA: Diagnosis not present

## 2020-10-11 DIAGNOSIS — M898X3 Other specified disorders of bone, forearm: Secondary | ICD-10-CM | POA: Diagnosis not present

## 2020-10-11 DIAGNOSIS — S52514A Nondisplaced fracture of right radial styloid process, initial encounter for closed fracture: Secondary | ICD-10-CM | POA: Diagnosis not present

## 2020-10-11 DIAGNOSIS — M25531 Pain in right wrist: Secondary | ICD-10-CM | POA: Diagnosis not present

## 2020-10-11 DIAGNOSIS — M25431 Effusion, right wrist: Secondary | ICD-10-CM | POA: Diagnosis not present

## 2020-10-20 ENCOUNTER — Other Ambulatory Visit: Payer: Self-pay | Admitting: Adult Health

## 2020-10-25 DIAGNOSIS — S52501A Unspecified fracture of the lower end of right radius, initial encounter for closed fracture: Secondary | ICD-10-CM | POA: Diagnosis not present

## 2020-10-25 DIAGNOSIS — S6291XA Unspecified fracture of right wrist and hand, initial encounter for closed fracture: Secondary | ICD-10-CM | POA: Diagnosis not present

## 2020-10-25 DIAGNOSIS — S52514D Nondisplaced fracture of right radial styloid process, subsequent encounter for closed fracture with routine healing: Secondary | ICD-10-CM | POA: Diagnosis not present

## 2020-10-25 DIAGNOSIS — S52514A Nondisplaced fracture of right radial styloid process, initial encounter for closed fracture: Secondary | ICD-10-CM | POA: Diagnosis not present

## 2020-11-08 DIAGNOSIS — S52514D Nondisplaced fracture of right radial styloid process, subsequent encounter for closed fracture with routine healing: Secondary | ICD-10-CM | POA: Diagnosis not present

## 2020-11-22 DIAGNOSIS — M24131 Other articular cartilage disorders, right wrist: Secondary | ICD-10-CM | POA: Diagnosis not present

## 2020-11-22 DIAGNOSIS — M778 Other enthesopathies, not elsewhere classified: Secondary | ICD-10-CM | POA: Diagnosis not present

## 2020-11-22 DIAGNOSIS — M25431 Effusion, right wrist: Secondary | ICD-10-CM | POA: Diagnosis not present

## 2020-11-22 DIAGNOSIS — M25831 Other specified joint disorders, right wrist: Secondary | ICD-10-CM | POA: Diagnosis not present

## 2020-11-22 DIAGNOSIS — S52514D Nondisplaced fracture of right radial styloid process, subsequent encounter for closed fracture with routine healing: Secondary | ICD-10-CM | POA: Diagnosis not present

## 2020-11-22 DIAGNOSIS — S52514A Nondisplaced fracture of right radial styloid process, initial encounter for closed fracture: Secondary | ICD-10-CM | POA: Diagnosis not present

## 2020-11-25 DIAGNOSIS — M25631 Stiffness of right wrist, not elsewhere classified: Secondary | ICD-10-CM | POA: Diagnosis not present

## 2020-11-25 DIAGNOSIS — M25431 Effusion, right wrist: Secondary | ICD-10-CM | POA: Diagnosis not present

## 2020-11-25 DIAGNOSIS — R531 Weakness: Secondary | ICD-10-CM | POA: Diagnosis not present

## 2020-11-25 DIAGNOSIS — M25531 Pain in right wrist: Secondary | ICD-10-CM | POA: Diagnosis not present

## 2020-11-26 DIAGNOSIS — M25431 Effusion, right wrist: Secondary | ICD-10-CM | POA: Diagnosis not present

## 2020-11-26 DIAGNOSIS — M25531 Pain in right wrist: Secondary | ICD-10-CM | POA: Diagnosis not present

## 2020-11-26 DIAGNOSIS — M25631 Stiffness of right wrist, not elsewhere classified: Secondary | ICD-10-CM | POA: Diagnosis not present

## 2020-11-26 DIAGNOSIS — R531 Weakness: Secondary | ICD-10-CM | POA: Diagnosis not present

## 2020-12-02 DIAGNOSIS — M25531 Pain in right wrist: Secondary | ICD-10-CM | POA: Diagnosis not present

## 2020-12-02 DIAGNOSIS — M25631 Stiffness of right wrist, not elsewhere classified: Secondary | ICD-10-CM | POA: Diagnosis not present

## 2020-12-02 DIAGNOSIS — M25431 Effusion, right wrist: Secondary | ICD-10-CM | POA: Diagnosis not present

## 2020-12-02 DIAGNOSIS — R531 Weakness: Secondary | ICD-10-CM | POA: Diagnosis not present

## 2020-12-04 DIAGNOSIS — M25531 Pain in right wrist: Secondary | ICD-10-CM | POA: Diagnosis not present

## 2020-12-04 DIAGNOSIS — M25431 Effusion, right wrist: Secondary | ICD-10-CM | POA: Diagnosis not present

## 2020-12-04 DIAGNOSIS — R531 Weakness: Secondary | ICD-10-CM | POA: Diagnosis not present

## 2020-12-04 DIAGNOSIS — M25631 Stiffness of right wrist, not elsewhere classified: Secondary | ICD-10-CM | POA: Diagnosis not present

## 2020-12-09 DIAGNOSIS — M25431 Effusion, right wrist: Secondary | ICD-10-CM | POA: Diagnosis not present

## 2020-12-09 DIAGNOSIS — M25531 Pain in right wrist: Secondary | ICD-10-CM | POA: Diagnosis not present

## 2020-12-09 DIAGNOSIS — M25631 Stiffness of right wrist, not elsewhere classified: Secondary | ICD-10-CM | POA: Diagnosis not present

## 2020-12-09 DIAGNOSIS — R531 Weakness: Secondary | ICD-10-CM | POA: Diagnosis not present

## 2020-12-17 DIAGNOSIS — M25531 Pain in right wrist: Secondary | ICD-10-CM | POA: Diagnosis not present

## 2020-12-17 DIAGNOSIS — M25431 Effusion, right wrist: Secondary | ICD-10-CM | POA: Diagnosis not present

## 2020-12-17 DIAGNOSIS — R531 Weakness: Secondary | ICD-10-CM | POA: Diagnosis not present

## 2020-12-17 DIAGNOSIS — M25631 Stiffness of right wrist, not elsewhere classified: Secondary | ICD-10-CM | POA: Diagnosis not present

## 2020-12-18 DIAGNOSIS — R531 Weakness: Secondary | ICD-10-CM | POA: Diagnosis not present

## 2020-12-18 DIAGNOSIS — M25531 Pain in right wrist: Secondary | ICD-10-CM | POA: Diagnosis not present

## 2020-12-18 DIAGNOSIS — M25431 Effusion, right wrist: Secondary | ICD-10-CM | POA: Diagnosis not present

## 2020-12-18 DIAGNOSIS — M25631 Stiffness of right wrist, not elsewhere classified: Secondary | ICD-10-CM | POA: Diagnosis not present

## 2020-12-19 DIAGNOSIS — E890 Postprocedural hypothyroidism: Secondary | ICD-10-CM | POA: Diagnosis not present

## 2020-12-20 LAB — T4, FREE: Free T4: 1.31 ng/dL (ref 0.82–1.77)

## 2020-12-20 LAB — TSH: TSH: 3.59 u[IU]/mL (ref 0.450–4.500)

## 2020-12-24 DIAGNOSIS — R531 Weakness: Secondary | ICD-10-CM | POA: Diagnosis not present

## 2020-12-24 DIAGNOSIS — M25431 Effusion, right wrist: Secondary | ICD-10-CM | POA: Diagnosis not present

## 2020-12-24 DIAGNOSIS — M25531 Pain in right wrist: Secondary | ICD-10-CM | POA: Diagnosis not present

## 2020-12-24 DIAGNOSIS — M25631 Stiffness of right wrist, not elsewhere classified: Secondary | ICD-10-CM | POA: Diagnosis not present

## 2020-12-25 ENCOUNTER — Encounter: Payer: Self-pay | Admitting: "Endocrinology

## 2020-12-25 ENCOUNTER — Other Ambulatory Visit: Payer: Self-pay

## 2020-12-25 ENCOUNTER — Telehealth (INDEPENDENT_AMBULATORY_CARE_PROVIDER_SITE_OTHER): Payer: PPO | Admitting: "Endocrinology

## 2020-12-25 VITALS — BP 113/76 | HR 74 | Ht 69.5 in | Wt 200.0 lb

## 2020-12-25 DIAGNOSIS — M8589 Other specified disorders of bone density and structure, multiple sites: Secondary | ICD-10-CM | POA: Diagnosis not present

## 2020-12-25 DIAGNOSIS — E890 Postprocedural hypothyroidism: Secondary | ICD-10-CM

## 2020-12-25 NOTE — Progress Notes (Signed)
12/25/2020                                    Endocrinology Telehealth Visit Follow up Note -During COVID -19 Pandemic  This visit type was conducted  via telephone due to national recommendations for restrictions regarding the COVID-19 Pandemic  in an effort to limit this patient's exposure and mitigate transmission of the corona virus.   I connected with Colleen Torres on 12/25/2020   by telephone and verified that I am speaking with the correct person using two identifiers. Colleen Torres, 01-23-1956. she has verbally consented to this visit.  I, Glade Lloyd , MD was in my office and patient , Colleen Torres , was in her residence. No other persons were with me during the encounter. All issues noted in this document were discussed and addressed. The format was not optimal for physical exam.   Subjective:    Patient ID: Colleen Torres, female    DOB: [birth year], PCP Sasser, Silvestre Moment, MD   Past Medical History:  Diagnosis Date   Abdominal pain, RLQ 09/26/2015   Arthritis    Bilateral Hands   Cancer ([place])    Follicular neoplasm of thyroid   Depression    Dysrhythmia    GERD (gastroesophageal reflux disease)    Hypothyroidism    Past Surgical History:  Procedure Laterality Date   APPENDECTOMY     CYST REMOVAL NECK     SUPRAVENTRICULAR TACHYCARDIA ABLATION  09/2002   THYROIDECTOMY Right 03/19/2014   Procedure: RIGHT THYROID LOBECTOMY;  Surgeon: Jamesetta So, MD;  Location: [facility];  Service: General;  Laterality: Right;   THYROIDECTOMY, PARTIAL Left 2011   TUBAL LIGATION      Family History  Problem Relation Age of Onset   Depression Father    Anxiety disorder Father    Other Mother        hip replacement; Torres stone   Depression Brother    Anxiety disorder Brother    Miscarriages / Stillbirths Daughter    Macular degeneration Maternal Grandmother    Heart disease Maternal Grandmother    Social History   Socioeconomic History   Marital status: Married    Spouse  name: Not on file   Number of children: 1   Years of education: Not on file   Highest education level: Not on file  Occupational History   Not on file  Tobacco Use   Smoking status: Never   Smokeless tobacco: Never  Vaping Use   Vaping Use: Never used  Substance and Sexual Activity   Alcohol use: No   Drug use: No   Sexual activity: Yes    Birth control/protection: Surgical    Comment: tubal  Other Topics Concern   Not on file  Social History Narrative   Not on file   Social Determinants of Health   Financial Resource Strain: Not on file  Food Insecurity: Not on file  Transportation Needs: Not on file  Physical Activity: Not on file  Stress: Not on file  Social Connections: Not on file   Outpatient Encounter Medications as of 12/25/2020  Medication Sig   levothyroxine (SYNTHROID) 112 MCG tablet TAKE ONE TABLET (100MCG TOTAL) BY MOUTH DAILY BEFORE BREAKFAST   levothyroxine (SYNTHROID) 112 MCG tablet Take 1 tablet (112 mcg total) by mouth daily.   Multiple Vitamins-Minerals (CENTRUM SILVER 50+WOMEN PO) Take 1 tablet by  mouth 2 (two) times daily.   Multiple Vitamins-Minerals (EYE VITAMINS PO) Take 2 capsules by mouth daily.    sertraline (ZOLOFT) 25 MG tablet TAKE 1 TABLET BY MOUTH ONCE A DAY.   No facility-administered encounter medications on file as of 12/25/2020.   ALLERGIES: No Known Allergies VACCINATION STATUS: Immunization History  Administered Date(s) Administered   Influenza,inj,Quad PF,6+ Mos 03/20/2014    HPI  65 year old female with medical history as above.   She is being engaged in telehealth via telephone for follow-up of her postsurgical hypothyroidism, hypocalcemia, osteopenia.     -  She is currently on levothyroxine 112 mcg p.o. daily before breakfast.  She reports compliance.  She has no new complaints today.  Her previsit thyroid function tests are consistent with appropriate replacement. -For mild postsurgical hypocalcemia, she has taken  herself off of calcium supplements, remains on multivitamin daily supplements.   Her recent labs show normal calcium.   Her recent bone density was consistent with stable findings compared to 2019.  See below. She has had history of multinodular goiter , on follow-up ultrasound guided FNA which showed FLUS, and underwent completion thyroidectomy with benign outcomes- December 2015.  She underwent her first  left hemithyroidectomy in 2000 for MNG, reportedly benign.      - she denies dysphagia, SOB. -She has steady weight since last visit. -She underwent bone density study which showed osteopenia.  Her bone density is scheduled to be done on July 17, 2019.  Review of Systems Limited as above.  Objective:    BP 113/76   Pulse 74   Ht 5' 9.5" (1.765 m)   Wt 200 lb (90.7 kg)   BMI 29.11 kg/m   Wt Readings from Last 3 Encounters:  12/25/20 200 lb (90.7 kg)  06/24/20 198 lb 6.4 oz (90 kg)  03/28/19 190 lb (86.2 kg)       Recent Results (from the past 2160 hour(s))  TSH     Status: None   Collection Time: 12/19/20  8:15 AM  Result Value Ref Range   TSH 3.590 0.450 - 4.500 uIU/mL  T4, free     Status: None   Collection Time: 12/19/20  8:15 AM  Result Value Ref Range   Free T4 1.31 0.82 - 1.77 ng/dL   On  02/26//2017 calcium 9 TSH 3.05, free T4 1 0.4 , Vitamin D 35  On 05/20/2015 PTH was 25 , calcium was 8.9   Bone density on July 12, 2017  AP Spine L1-L4 07/12/2017 61.9 Osteopenia -1.3 1.021 g/cm2  DualFemur Neck Right 07/12/2017 61.9 Osteopenia -1.9 0.774 g/cm2 DualFemur Total Mean 07/12/2017 61.9 Osteopenia -1.3 0.850 g/cm2 ASSESSMENT: BMD as determined from Femur Neck Right is 0.774 g/cm2 with a T-Score of -1.9. This patient is considered osteopenic according to [facility]) criteria.   Bone density on July 17, 2019:  AP Spine L1-L4 07/17/2019 65.0 Osteopenia -1.4 1.008 g/cm2 -1.3% - AP Spine L1-L4 07/12/2017 61.9 Osteopenia -1.3 1.021 g/cm2 -  -   DualFemur Neck Right 07/17/2019 65.0 Osteopenia -1.9 0.771 g/cm2 -0.4% - DualFemur Neck Right 07/12/2017 61.9 Osteopenia -1.9 0.774 g/cm2 - -   DualFemur Total Mean 07/17/2019 65.0 Osteopenia -1.2 0.853 g/cm2 0.4% - DualFemur Total Mean 07/12/2017 61.9 Osteopenia -1.3 0.850 g/cm2 - - ASSESSMENT: The BMD measured at Femur Neck Right is 0.771 g/cm2 with a T-score of -1.9. This patient is considered osteopenic according to [facility]) criteria. The scan quality is good. Compared with the  prior study on 07/12/2017, the BMD of the lumbar spine, right femoral neck and total mean shows no statistically significant change.    Assessment & Plan:   1. Postsurgical hypothyroidism She has history of multinodular goiter. She underwent remnant thyroidectomy on 03/19/14, no malignancy. Her previsit thyroid function tests are consistent with appropriate replacement.  She is advised to continue  levothyroxine to 112 mcg p.o. daily before breakfast.     - We discussed about the correct intake of her thyroid hormone, on empty stomach at fasting, with water, separated by at least 30 minutes from breakfast and other medications,  and separated by more than 4 hours from calcium, iron, multivitamins, acid reflux medications (PPIs). -Patient is made aware of the fact that thyroid hormone replacement is needed for life, dose to be adjusted by periodic monitoring of thyroid function tests.   2. Hypocalcemia -Her recent calcium is 9.1 mg per DL.  She is only on multivitamin supplements of calcium supplements.  She is advised to continue on same.      3.  Osteopenia:  She has family history of osteoporosis.   -Her FRAX fracture risk calculation reveals 10-year probability of major osteoporosis-related fracture is 10%, while her risk for hip fracture is close to 0%.   -She will not need bisphosphonate therapy at this time.  She is advised to continue vitamin D and calcium supplements,   repeat bone density in April 2021 was still consistent with osteopenia.  Her next bone density will be due in April 2023.     - I advised patient to maintain close follow up at [facility]  for primary care needs.   I spent 22 minutes in the care of the patient today including review of labs from Thyroid Function, CMP, and other relevant labs ; imaging/biopsy records (current and previous including abstractions from other facilities); face-to-face time discussing  her lab results and symptoms, medications doses, her options of short and long term treatment based on the latest standards of care / guidelines;   and documenting the encounter.  Colleen Torres  participated in the discussions, expressed understanding, and voiced agreement with the above plans.  All questions were answered to her satisfaction. she is encouraged to contact clinic should she have any questions or concerns prior to her return visit.    Follow up plan: Return in about 7 months (around 07/25/2021) for F/U with Pre-visit Labs, DXA Scan B4 NV.  Glade Lloyd, MD Phone: [phone]  Fax: [phone]  -  This note was partially dictated with voice recognition software. Similar sounding words can be transcribed inadequately or may not  be corrected upon review.  12/25/2020, 10:11 AM

## 2020-12-30 DIAGNOSIS — R531 Weakness: Secondary | ICD-10-CM | POA: Diagnosis not present

## 2020-12-30 DIAGNOSIS — M25431 Effusion, right wrist: Secondary | ICD-10-CM | POA: Diagnosis not present

## 2020-12-30 DIAGNOSIS — M25531 Pain in right wrist: Secondary | ICD-10-CM | POA: Diagnosis not present

## 2020-12-30 DIAGNOSIS — M25631 Stiffness of right wrist, not elsewhere classified: Secondary | ICD-10-CM | POA: Diagnosis not present

## 2020-12-31 DIAGNOSIS — M25631 Stiffness of right wrist, not elsewhere classified: Secondary | ICD-10-CM | POA: Diagnosis not present

## 2020-12-31 DIAGNOSIS — M25431 Effusion, right wrist: Secondary | ICD-10-CM | POA: Diagnosis not present

## 2020-12-31 DIAGNOSIS — M25531 Pain in right wrist: Secondary | ICD-10-CM | POA: Diagnosis not present

## 2020-12-31 DIAGNOSIS — R531 Weakness: Secondary | ICD-10-CM | POA: Diagnosis not present

## 2021-01-06 DIAGNOSIS — M25431 Effusion, right wrist: Secondary | ICD-10-CM | POA: Diagnosis not present

## 2021-01-06 DIAGNOSIS — M25631 Stiffness of right wrist, not elsewhere classified: Secondary | ICD-10-CM | POA: Diagnosis not present

## 2021-01-06 DIAGNOSIS — M25531 Pain in right wrist: Secondary | ICD-10-CM | POA: Diagnosis not present

## 2021-01-06 DIAGNOSIS — R531 Weakness: Secondary | ICD-10-CM | POA: Diagnosis not present

## 2021-01-07 DIAGNOSIS — M25631 Stiffness of right wrist, not elsewhere classified: Secondary | ICD-10-CM | POA: Diagnosis not present

## 2021-01-07 DIAGNOSIS — R531 Weakness: Secondary | ICD-10-CM | POA: Diagnosis not present

## 2021-01-07 DIAGNOSIS — M25531 Pain in right wrist: Secondary | ICD-10-CM | POA: Diagnosis not present

## 2021-01-07 DIAGNOSIS — M25431 Effusion, right wrist: Secondary | ICD-10-CM | POA: Diagnosis not present

## 2021-01-13 DIAGNOSIS — M25531 Pain in right wrist: Secondary | ICD-10-CM | POA: Diagnosis not present

## 2021-01-13 DIAGNOSIS — M25431 Effusion, right wrist: Secondary | ICD-10-CM | POA: Diagnosis not present

## 2021-01-13 DIAGNOSIS — R531 Weakness: Secondary | ICD-10-CM | POA: Diagnosis not present

## 2021-01-13 DIAGNOSIS — M25631 Stiffness of right wrist, not elsewhere classified: Secondary | ICD-10-CM | POA: Diagnosis not present

## 2021-01-14 DIAGNOSIS — M25431 Effusion, right wrist: Secondary | ICD-10-CM | POA: Diagnosis not present

## 2021-01-14 DIAGNOSIS — R531 Weakness: Secondary | ICD-10-CM | POA: Diagnosis not present

## 2021-01-14 DIAGNOSIS — M25631 Stiffness of right wrist, not elsewhere classified: Secondary | ICD-10-CM | POA: Diagnosis not present

## 2021-01-14 DIAGNOSIS — M25531 Pain in right wrist: Secondary | ICD-10-CM | POA: Diagnosis not present

## 2021-01-19 ENCOUNTER — Other Ambulatory Visit: Payer: Self-pay | Admitting: Adult Health

## 2021-01-21 DIAGNOSIS — R531 Weakness: Secondary | ICD-10-CM | POA: Diagnosis not present

## 2021-01-21 DIAGNOSIS — M25631 Stiffness of right wrist, not elsewhere classified: Secondary | ICD-10-CM | POA: Diagnosis not present

## 2021-01-21 DIAGNOSIS — M25531 Pain in right wrist: Secondary | ICD-10-CM | POA: Diagnosis not present

## 2021-01-21 DIAGNOSIS — M25431 Effusion, right wrist: Secondary | ICD-10-CM | POA: Diagnosis not present

## 2021-01-28 DIAGNOSIS — M25631 Stiffness of right wrist, not elsewhere classified: Secondary | ICD-10-CM | POA: Diagnosis not present

## 2021-01-28 DIAGNOSIS — M25531 Pain in right wrist: Secondary | ICD-10-CM | POA: Diagnosis not present

## 2021-01-28 DIAGNOSIS — R531 Weakness: Secondary | ICD-10-CM | POA: Diagnosis not present

## 2021-01-28 DIAGNOSIS — M25431 Effusion, right wrist: Secondary | ICD-10-CM | POA: Diagnosis not present

## 2021-02-03 DIAGNOSIS — Z23 Encounter for immunization: Secondary | ICD-10-CM | POA: Diagnosis not present

## 2021-04-02 DIAGNOSIS — Z1283 Encounter for screening for malignant neoplasm of skin: Secondary | ICD-10-CM | POA: Diagnosis not present

## 2021-04-02 DIAGNOSIS — B351 Tinea unguium: Secondary | ICD-10-CM | POA: Diagnosis not present

## 2021-04-02 DIAGNOSIS — L570 Actinic keratosis: Secondary | ICD-10-CM | POA: Diagnosis not present

## 2021-04-17 DIAGNOSIS — E039 Hypothyroidism, unspecified: Secondary | ICD-10-CM | POA: Diagnosis not present

## 2021-04-17 DIAGNOSIS — G63 Polyneuropathy in diseases classified elsewhere: Secondary | ICD-10-CM | POA: Diagnosis not present

## 2021-04-17 DIAGNOSIS — E038 Other specified hypothyroidism: Secondary | ICD-10-CM | POA: Diagnosis not present

## 2021-04-17 DIAGNOSIS — M8580 Other specified disorders of bone density and structure, unspecified site: Secondary | ICD-10-CM | POA: Diagnosis not present

## 2021-04-17 DIAGNOSIS — F3342 Major depressive disorder, recurrent, in full remission: Secondary | ICD-10-CM | POA: Diagnosis not present

## 2021-04-17 DIAGNOSIS — E663 Overweight: Secondary | ICD-10-CM | POA: Diagnosis not present

## 2021-04-20 ENCOUNTER — Other Ambulatory Visit: Payer: Self-pay | Admitting: Adult Health

## 2021-07-20 ENCOUNTER — Other Ambulatory Visit: Payer: Self-pay | Admitting: Adult Health

## 2021-07-21 ENCOUNTER — Ambulatory Visit (HOSPITAL_COMMUNITY)
Admission: RE | Admit: 2021-07-21 | Discharge: 2021-07-21 | Disposition: A | Discharge status: Home / Self Care | Payer: PPO | Source: Ambulatory Visit | Attending: "Endocrinology | Admitting: "Endocrinology

## 2021-07-21 DIAGNOSIS — M8589 Other specified disorders of bone density and structure, multiple sites: Secondary | ICD-10-CM | POA: Diagnosis not present

## 2021-07-21 DIAGNOSIS — Z780 Asymptomatic menopausal state: Secondary | ICD-10-CM | POA: Diagnosis not present

## 2021-07-22 DIAGNOSIS — M8589 Other specified disorders of bone density and structure, multiple sites: Secondary | ICD-10-CM | POA: Diagnosis not present

## 2021-07-22 DIAGNOSIS — E890 Postprocedural hypothyroidism: Secondary | ICD-10-CM | POA: Diagnosis not present

## 2021-07-23 LAB — TSH: TSH: 2 u[IU]/mL (ref 0.450–4.500)

## 2021-07-23 LAB — COMPREHENSIVE METABOLIC PANEL
ALT: 18 IU/L (ref 0–32)
AST: 24 IU/L (ref 0–40)
Albumin/Globulin Ratio: 1.5 (ref 1.2–2.2)
Albumin: 4.2 g/dL (ref 3.8–4.8)
Alkaline Phosphatase: 69 IU/L (ref 44–121)
BUN/Creatinine Ratio: 11 — ABNORMAL LOW (ref 12–28)
BUN: 9 mg/dL (ref 8–27)
Bilirubin Total: 0.4 mg/dL (ref 0.0–1.2)
CO2: 26 mmol/L (ref 20–29)
Calcium: 9.3 mg/dL (ref 8.7–10.3)
Chloride: 101 mmol/L (ref 96–106)
Creatinine, Ser: 0.82 mg/dL (ref 0.57–1.00)
Globulin, Total: 2.8 g/dL (ref 1.5–4.5)
Glucose: 95 mg/dL (ref 70–99)
Potassium: 4.7 mmol/L (ref 3.5–5.2)
Sodium: 140 mmol/L (ref 134–144)
Total Protein: 7 g/dL (ref 6.0–8.5)
eGFR: 79 mL/min/{1.73_m2} (ref >59)

## 2021-07-23 LAB — VITAMIN D 25 HYDROXY (VIT D DEFICIENCY, FRACTURES): Vit D, 25-Hydroxy: 31.5 ng/mL (ref 30.0–100.0)

## 2021-07-23 LAB — T4, FREE: Free T4: 1.39 ng/dL (ref 0.82–1.77)

## 2021-07-28 ENCOUNTER — Ambulatory Visit: Payer: PPO | Admitting: "Endocrinology

## 2021-07-30 ENCOUNTER — Ambulatory Visit: Payer: PPO | Admitting: "Endocrinology

## 2021-07-30 ENCOUNTER — Encounter: Payer: Self-pay | Admitting: "Endocrinology

## 2021-07-30 VITALS — BP 100/78 | HR 72 | Ht 68.0 in | Wt 205.8 lb

## 2021-07-30 DIAGNOSIS — M8589 Other specified disorders of bone density and structure, multiple sites: Secondary | ICD-10-CM

## 2021-07-30 DIAGNOSIS — Z6831 Body mass index (BMI) 31.0-31.9, adult: Secondary | ICD-10-CM

## 2021-07-30 DIAGNOSIS — E6609 Other obesity due to excess calories: Secondary | ICD-10-CM | POA: Diagnosis not present

## 2021-07-30 DIAGNOSIS — E890 Postprocedural hypothyroidism: Secondary | ICD-10-CM | POA: Diagnosis not present

## 2021-07-30 MED ORDER — LEVOTHYROXINE SODIUM 112 MCG PO TABS: 112.0000 ug | ORAL_TABLET | Freq: Every day | ORAL | 1 refills | Status: DC

## 2021-07-30 NOTE — Progress Notes (Signed)
? ?07/30/2021 ?    ? ?Endocrinology follow-up note ? ? ?Subjective:  ? ? Patient ID: Colleen Torres, female    DOB: 1955/06/17, PCP Sasser, Silvestre Moment, MD ? ? ?Past Medical History:  ?Diagnosis Date  ? Abdominal pain, RLQ 09/26/2015  ? Arthritis   ? Bilateral Hands  ? Cancer [facility])   ? Follicular neoplasm of thyroid  ? Depression   ? Dysrhythmia   ? GERD (gastroesophageal reflux disease)   ? Hypothyroidism   ? ?Past Surgical History:  ?Procedure Laterality Date  ? APPENDECTOMY    ? CYST REMOVAL NECK    ? SUPRAVENTRICULAR TACHYCARDIA ABLATION  09/2002  ? THYROIDECTOMY Right 03/19/2014  ? Procedure: RIGHT THYROID LOBECTOMY;  Surgeon: Jamesetta So, MD;  Location: [facility];  Service: General;  Laterality: Right;  ? THYROIDECTOMY, PARTIAL Left 2011  ? TUBAL LIGATION    ? ? ?Family History  ?Problem Relation Age of Onset  ? Depression Father   ? Anxiety disorder Father   ? Other Mother   ?     hip replacement; Torres stone  ? Depression Brother   ? Anxiety disorder Brother   ? Miscarriages / [place] Daughter   ? Macular degeneration Maternal Grandmother   ? Heart disease Maternal Grandmother   ? ?Social History  ? ?Socioeconomic History  ? Marital status: Married  ?  Spouse name: Not on file  ? Number of children: 1  ? Years of education: Not on file  ? Highest education level: Not on file  ?Occupational History  ? Not on file  ?Tobacco Use  ? Smoking status: Never  ? Smokeless tobacco: Never  ?Vaping Use  ? Vaping Use: Never used  ?Substance and Sexual Activity  ? Alcohol use: No  ? Drug use: No  ? Sexual activity: Yes  ?  Birth control/protection: Surgical  ?  Comment: tubal  ?Other Topics Concern  ? Not on file  ?Social History Narrative  ? Not on file  ? ?Social Determinants of Health  ? ?Financial Resource Strain: Not on file  ?Food Insecurity: Not on file  ?Transportation Needs: Not on file  ?Physical Activity: Not on file  ?Stress: Not on file  ?Social Connections: Not on file  ? ?Outpatient Encounter Medications as of  07/30/2021  ?Medication Sig  ? levothyroxine (SYNTHROID) 112 MCG tablet Take 1 tablet (112 mcg total) by mouth daily.  ? Multiple Vitamins-Minerals (CENTRUM SILVER 50+WOMEN PO) Take 1 tablet by mouth 2 (two) times daily.  ? Multiple Vitamins-Minerals (EYE VITAMINS PO) Take 2 capsules by mouth daily.   ? sertraline (ZOLOFT) 25 MG tablet TAKE 1 TABLET BY MOUTH ONCE A DAY.  ? [DISCONTINUED] levothyroxine (SYNTHROID) 112 MCG tablet TAKE ONE TABLET (100MCG TOTAL) BY MOUTH DAILY BEFORE BREAKFAST  ? [DISCONTINUED] levothyroxine (SYNTHROID) 112 MCG tablet Take 1 tablet (112 mcg total) by mouth daily.  ? ?No facility-administered encounter medications on file as of 07/30/2021.  ? ?ALLERGIES: ?No Known Allergies ?VACCINATION STATUS: ?Immunization History  ?Administered Date(s) Administered  ? Influenza,inj,Quad PF,6+ Mos 03/20/2014  ? ? ?HPI ? ?66 year old female with medical history as above.   She is being seen for follow-up of her postsurgical hypothyroidism, hypocalcemia, osteopenia.   ? ? ?-  She is currently on levothyroxine 112 mcg p.o. daily before breakfast.  She reports compliance.  She has no new complaints today, except that she is unable to lose weight.  Her previsit thyroid function tests are consistent with appropriate replacement. ?-For mild  postsurgical hypocalcemia, she has taken herself off of calcium supplements, remains on multivitamin daily supplements.   ?Her recent labs show normal calcium.   ?Her recent bone density was consistent with stable/slight improvement compared to 2021 studies.   ? See below. ?She has had history of multinodular goiter , on follow-up ultrasound guided FNA which showed FLUS, and underwent completion thyroidectomy with benign outcomes- December 2015.  ?She underwent her first  left hemithyroidectomy in 2000 for MNG, reportedly benign.  ?   ? - she denies dysphagia, SOB. ?-She has steady weight since last visit. ?-She underwent bone density study which showed osteopenia.  Her bone  density is scheduled to be done on July 17, 2019. ? ?Review of Systems ?Limited as above. ? ?Objective:  ?  ?BP 100/78   Pulse 72   Ht _0  (1.727 m)   Wt 205 lb 12.8 oz (93.4 kg)   BMI 31.29 kg/m?   ?Wt Readings from Last 3 Encounters:  ?07/30/21 205 lb 12.8 oz (93.4 kg)  ?12/25/20 200 lb (90.7 kg)  ?06/24/20 198 lb 6.4 oz (90 kg)  ?  ? ? ? ?Recent Results (from the past 2160 hour(s))  ?TSH     Status: None  ? Collection Time: 07/22/21  8:25 AM  ?Result Value Ref Range  ? TSH 2.000 0.450 - 4.500 uIU/mL  ?T4, free     Status: None  ? Collection Time: 07/22/21  8:25 AM  ?Result Value Ref Range  ? Free T4 1.39 0.82 - 1.77 ng/dL  ?Comprehensive metabolic panel     Status: Abnormal  ? Collection Time: 07/22/21  8:25 AM  ?Result Value Ref Range  ? Glucose 95 70 - 99 mg/dL  ? BUN 9 8 - 27 mg/dL  ? Creatinine, Ser 0.82 0.57 - 1.00 mg/dL  ? eGFR 79 >59 mL/min/1.73  ? BUN/Creatinine Ratio 11 (L) 12 - 28  ? Sodium 140 134 - 144 mmol/L  ? Potassium 4.7 3.5 - 5.2 mmol/L  ? Chloride 101 96 - 106 mmol/L  ? CO2 26 20 - 29 mmol/L  ? Calcium 9.3 8.7 - 10.3 mg/dL  ? Total Protein 7.0 6.0 - 8.5 g/dL  ? Albumin 4.2 3.8 - 4.8 g/dL  ? Globulin, Total 2.8 1.5 - 4.5 g/dL  ? Albumin/Globulin Ratio 1.5 1.2 - 2.2  ? Bilirubin Total 0.4 0.0 - 1.2 mg/dL  ? Alkaline Phosphatase 69 44 - 121 IU/L  ? AST 24 0 - 40 IU/L  ? ALT 18 0 - 32 IU/L  ?VITAMIN D 25 Hydroxy (Vit-D Deficiency, Fractures)     Status: None  ? Collection Time: 07/22/21  8:25 AM  ?Result Value Ref Range  ? Vit D, 25-Hydroxy 31.5 30.0 - 100.0 ng/mL  ?  Comment: Vitamin D deficiency has been defined by the Institute of ?Medicine and an Endocrine Society practice guideline as a ?level of serum 25-OH vitamin D less than 20 ng/mL (1,2). ?The Endocrine Society went on to further define vitamin D ?insufficiency as a level between 21 and 29 ng/mL (2). ?1. IOM (Institute of Medicine). 2010. Dietary reference ?   intakes for calcium and D. [place]: The ?   [employer]. ?2. Holick MF, [place], Bischoff-Ferrari HA, et al. ?   Evaluation, treatment, and prevention of vitamin D ?   deficiency: an Endocrine Society clinical practice ?   guideline. JCEM. 2011 Jul; 96(7):1911-30. ?  ? ?Bone density from July 21, 2021 ? ?AP Spine L1-L4  07/12/2017 61.9 Osteopenia -1.3 1.021 g/cm2  ?DualFemur Neck Right 07/12/2017 61.9 Osteopenia -1.9 0.774 g/cm2 ?DualFemur Total Mean 07/12/2017 61.9 Osteopenia -1.3 0.850 g/cm2 ?ASSESSMENT: ?BMD as determined from Femur Neck Right is 0.774 g/cm2 with a ?T-Score of -1.9. This patient is considered osteopenic according to ?World Health Organization [facility]) criteria. ?AP Spine L1-L3 07/21/2021 66.0 Osteopenia -1.2 1.024 g/cm2 2.6% - ?AP Spine L1-L3 07/17/2019 64.0 Osteopenia -1.4 0.998 g/cm2 -1.6% - ?AP Spine L1-L3 07/12/2017 61.9 Osteopenia -1.3 1.014 g/cm2 - - ?  ?DualFemur Neck Right 07/21/2021 66.0 Osteopenia -1.8 0.787 g/cm2 ?2.1% - ?DualFemur Neck Right 07/17/2019 64.0 Osteopenia -1.9 0.771 g/cm2 ?-0.4% - ?DualFemur Neck Right 07/12/2017 61.9 Osteopenia -1.9 0.774 g/cm2 - - ?  ?DualFemur Total Mean 07/21/2021 66.0 Osteopenia -1.3 0.849 g/cm2 ?-0.5% - ?DualFemur Total Mean 07/17/2019 64.0 Osteopenia -1.2 0.853 g/cm2 ?0.4% - ?DualFemur Total Mean 07/12/2017 61.9 Osteopenia -1.3 0.850 g/cm2 - - ? ? ? ?Assessment & Plan:  ? ?1. Postsurgical hypothyroidism ?She has history of multinodular goiter. She underwent remnant thyroidectomy on 03/19/14, no malignancy. ?Her previsit thyroid function tests are consistent with appropriate replacement.  She is advised to continue  levothyroxine to 112 mcg p.o. daily before breakfast.   ? ? - We discussed about the correct intake of her thyroid hormone, on empty stomach at fasting, with water, separated by at least 30 minutes from breakfast and other medications,  and separated by more than 4 hours from calcium, iron, multivitamins, acid reflux medications (PPIs). ?-Patient is made aware of the fact that thyroid  hormone replacement is needed for life, dose to be adjusted by periodic monitoring of thyroid function tests. ? ? ? ?2. Hypocalcemia ?-Her recent calcium is 9.1 mg per DL.  She is only on multivitamin suppleme

## 2021-08-12 DIAGNOSIS — R0602 Shortness of breath: Secondary | ICD-10-CM | POA: Diagnosis not present

## 2021-08-12 DIAGNOSIS — I10 Essential (primary) hypertension: Secondary | ICD-10-CM | POA: Diagnosis not present

## 2021-08-12 DIAGNOSIS — E039 Hypothyroidism, unspecified: Secondary | ICD-10-CM | POA: Diagnosis not present

## 2021-08-12 DIAGNOSIS — Z6829 Body mass index (BMI) 29.0-29.9, adult: Secondary | ICD-10-CM | POA: Diagnosis not present

## 2021-08-12 DIAGNOSIS — R5383 Other fatigue: Secondary | ICD-10-CM | POA: Diagnosis not present

## 2021-08-12 DIAGNOSIS — J309 Allergic rhinitis, unspecified: Secondary | ICD-10-CM | POA: Diagnosis not present

## 2021-08-28 NOTE — Progress Notes (Signed)
Cardiology Office Note:    Date:  08/29/2021   ID:  Colleen Torres, DOB 09/17/1955, MRN [id number]  PCP:  Manon Hilding, MD   [facility] Providers Cardiologist:  None     Referring MD: Manon Hilding, MD   No chief complaint on file. DOE  History of Present Illness:    Colleen Torres is a 66 y.o. female with a hx of GERD, arthritis, follicular neoplasm of the thyroid s/p thyroidectomy, referral for DOE. She feels low energy.  When she's walking she feels more short of breath. She works in the garden and has noted symptoms. She has hx of SVT ablation 15-20 years, in [place], [place]. She denies chest pressure. Non smoker.  Her mother has CHF at 6. Father had an irregular heart beat.   Past Medical History:  Diagnosis Date   Abdominal pain, RLQ 09/26/2015   Arthritis    Bilateral Hands   Cancer (HCC)    Follicular neoplasm of thyroid   Depression    Dysrhythmia    GERD (gastroesophageal reflux disease)    Hypothyroidism     Past Surgical History:  Procedure Laterality Date   APPENDECTOMY     CYST REMOVAL NECK     SUPRAVENTRICULAR TACHYCARDIA ABLATION  09/2002   THYROIDECTOMY Right 03/19/2014   Procedure: RIGHT THYROID LOBECTOMY;  Surgeon: Jamesetta So, MD;  Location: [facility];  Service: General;  Laterality: Right;   THYROIDECTOMY, PARTIAL Left 2011   TUBAL LIGATION      Current Medications: Current Meds  Medication Sig   Black Elderberry (SAMBUCUS ELDERBERRY PO) 1,250 mg.   levothyroxine (SYNTHROID) 112 MCG tablet Take 1 tablet (112 mcg total) by mouth daily.   Multiple Vitamins-Minerals (CENTRUM SILVER 50+WOMEN PO) Take 1 tablet by mouth 2 (two) times daily.   Multiple Vitamins-Minerals (EYE VITAMINS PO) Take 2 capsules by mouth daily.    sertraline (ZOLOFT) 25 MG tablet TAKE 1 TABLET BY MOUTH ONCE A DAY.     Allergies:   Patient has no known allergies.   Social History   Socioeconomic History   Marital status: Married    Spouse name: Not on file   Number  of children: 1   Years of education: Not on file   Highest education level: Not on file  Occupational History   Not on file  Tobacco Use   Smoking status: Never   Smokeless tobacco: Never  Vaping Use   Vaping Use: Never used  Substance and Sexual Activity   Alcohol use: No   Drug use: No   Sexual activity: Yes    Birth control/protection: Surgical    Comment: tubal  Other Topics Concern   Not on file  Social History Narrative   Not on file   Social Determinants of Health   Financial Resource Strain: Not on file  Food Insecurity: Not on file  Transportation Needs: Not on file  Physical Activity: Not on file  Stress: Not on file  Social Connections: Not on file     Family History: The patient's family history includes Anxiety disorder in her brother and father; Depression in her brother and father; Heart disease in her maternal grandmother; Macular degeneration in her maternal grandmother; Miscarriages / [place] in her daughter; Other in her mother.  ROS:   Please see the history of present illness.     All other systems reviewed and are negative.  EKGs/Labs/Other Studies Reviewed:    The following studies were reviewed today:  EKG:  EKG is  ordered today.  The ekg ordered today demonstrates   08/28/2021-NSR, poor r wave progression  Recent Labs: 07/22/2021: ALT 18; BUN 9; Creatinine, Ser 0.82; Potassium 4.7; Sodium 140; TSH 2.000  Recent Lipid Panel No results found for: CHOL, TRIG, HDL, CHOLHDL, VLDL, LDLCALC, LDLDIRECT   Risk Assessment/Calculations:           Physical Exam:    VS:    Vitals:   08/29/21 0930  BP: 109/73  Pulse: 68  SpO2: 98%     Wt Readings from Last 3 Encounters:  08/29/21 198 lb 6.4 oz (90 kg)  07/30/21 205 lb 12.8 oz (93.4 kg)  12/25/20 200 lb (90.7 kg)     GEN:  Well nourished, well developed in no acute distress HEENT: Normal NECK: No JVD; No carotid bruits LYMPHATICS: No lymphadenopathy CARDIAC: RRR, no  murmurs, rubs, gallops RESPIRATORY:  Clear to auscultation without rales, wheezing or rhonchi  ABDOMEN: Soft, non-tender, non-distended MUSCULOSKELETAL:  No edema; No deformity  SKIN: Warm and dry NEUROLOGIC:  Alert and oriented x 3 PSYCHIATRIC:  Normal affect   ASSESSMENT:    DOE: patient notes dyspnea with minimal activity. CVD risk includes age. With progressive symptoms will plan for an ischemic evaluation with an exercise MPI. PLAN:    In order of problems listed above:  Exercise MPI Follow up pending result       Shared Decision Making/Informed Consent The risks [chest pain, shortness of breath, cardiac arrhythmias, dizziness, blood pressure fluctuations, myocardial infarction, stroke/transient ischemic attack, nausea, vomiting, allergic reaction, radiation exposure, metallic taste sensation and life-threatening complications (estimated to be 1 in 10,000)], benefits (risk stratification, diagnosing coronary artery disease, treatment guidance) and alternatives of a nuclear stress test were discussed in detail with Colleen Torres and she agrees to proceed.    Medication Adjustments/Labs and Tests Ordered: Current medicines are reviewed at length with the patient today.  Concerns regarding medicines are outlined above.  Orders Placed This Encounter  Procedures   Cardiac Stress Test: Informed Consent Details: Physician/Practitioner Attestation; Transcribe to consent form and obtain patient signature   MYOCARDIAL PERFUSION IMAGING   EKG 12-Lead   No orders of the defined types were placed in this encounter.   Patient Instructions  Medication Instructions:   No changes   Lab Work:  Not needed   Testing/Procedures:  Will be schedule at [place] has requested that you have en exercise stress myoview. Please follow instruction sheet, as given.   Follow-Up: At [facility], you and your health needs are our priority.  As part of  our continuing mission to provide you with exceptional heart care, we have created designated Provider Care Teams.  These Care Teams include your primary Cardiologist (physician) and Advanced Practice Providers (APPs -  Physician Assistants and Nurse Practitioners) who all work together to provide you with the care you need, when you need it.     Your next appointment:   As needed   The format for your next appointment:   In Person  Provider:   Dr Jerilynn Mages. Harl Bowie   Other Instructions     Your doctor has scheduled you for a Myocardial Perfusion scan will  obtain information about the blood flow to your heart. The test consists of taking pictures of your heart in two phases: while resting and after a stress test.  The stress test may involve walking on a treadmill, or if you are unable to exercise  adequately, you will be given a drug intended to have a similar effect on the heart to that of exercise.  The test will take approximately 3 to 4  hours to complete.  If you are pregnant or breastfeeding,  please notify the staff prior to your test.  How to prepare for your test: Do not eat or drink 2 hours prior to your test Do not consume products containing caffeine 12 hours prior to your test (examples: coffee (regular OR decaf), chocolate, sodas, tea) Your doctor may need you to hold certain medications prior to the test.  If so, these are listed below and should not be taken for 24 hours prior to the test.  If not listed below, you may take your medications as normal.  You may resume taking held medications on your normal schedule once the test is complete.   Meds to hold: none Do bring a list of your current medications with you.  If you have held any meds in preparation for the test, please bring them, as you may be required to take them once the test is completed. Do wear comfortable clothes and walking shoes.  Do not wear dresses or overalls. Do NOT wear cologne, perfume, aftershave, or  fragranced lotions the day of your test (deodorants okay). If these instructions are not followed your test will have to be rescheduled.   A nuclear cardiologist will review your test, prepare a report and send it to your physician.   If you have questions or concerns about your appointment, you can call the Nuclear Cardiology department at [phone]. If you cannot keep your appointment, please provide 48 hours notification to avoid a possible $50.00 charge to your account.   Please arrive 15 minutes prior to your appointment time for registration and insurance purposes    Signed, Janina Mayo, MD  08/29/2021 10:13 AM    [place]

## 2021-08-29 ENCOUNTER — Encounter: Payer: Self-pay | Admitting: Internal Medicine

## 2021-08-29 ENCOUNTER — Ambulatory Visit (INDEPENDENT_AMBULATORY_CARE_PROVIDER_SITE_OTHER): Payer: PPO | Admitting: Internal Medicine

## 2021-08-29 VITALS — BP 109/73 | HR 68 | Ht 69.0 in | Wt 198.4 lb

## 2021-08-29 DIAGNOSIS — R0609 Other forms of dyspnea: Secondary | ICD-10-CM

## 2021-08-29 NOTE — Patient Instructions (Signed)
Medication Instructions:   No changes   Lab Work:  Not needed   Testing/Procedures:  Will be schedule at [place] has requested that you have en exercise stress myoview. Please follow instruction sheet, as given.   Follow-Up: At [facility], you and your health needs are our priority.  As part of our continuing mission to provide you with exceptional heart care, we have created designated Provider Care Teams.  These Care Teams include your primary Cardiologist (physician) and Advanced Practice Providers (APPs -  Physician Assistants and Nurse Practitioners) who all work together to provide you with the care you need, when you need it.     Your next appointment:   As needed   The format for your next appointment:   In Person  Provider:   Dr Jerilynn Mages. Harl Bowie   Other Instructions     Your doctor has scheduled you for a Myocardial Perfusion scan will  obtain information about the blood flow to your heart. The test consists of taking pictures of your heart in two phases: while resting and after a stress test.  The stress test may involve walking on a treadmill, or if you are unable to exercise adequately, you will be given a drug intended to have a similar effect on the heart to that of exercise.  The test will take approximately 3 to 4  hours to complete.  If you are pregnant or breastfeeding,  please notify the staff prior to your test.  How to prepare for your test: Do not eat or drink 2 hours prior to your test Do not consume products containing caffeine 12 hours prior to your test (examples: coffee (regular OR decaf), chocolate, sodas, tea) Your doctor may need you to hold certain medications prior to the test.  If so, these are listed below and should not be taken for 24 hours prior to the test.  If not listed below, you may take your medications as normal.  You may resume taking held medications on your normal schedule once the test is  complete.   Meds to hold: none Do bring a list of your current medications with you.  If you have held any meds in preparation for the test, please bring them, as you may be required to take them once the test is completed. Do wear comfortable clothes and walking shoes.  Do not wear dresses or overalls. Do NOT wear cologne, perfume, aftershave, or fragranced lotions the day of your test (deodorants okay). If these instructions are not followed your test will have to be rescheduled.   A nuclear cardiologist will review your test, prepare a report and send it to your physician.   If you have questions or concerns about your appointment, you can call the Nuclear Cardiology department at [phone]. If you cannot keep your appointment, please provide 48 hours notification to avoid a possible $50.00 charge to your account.   Please arrive 15 minutes prior to your appointment time for registration and insurance purposes

## 2021-09-01 ENCOUNTER — Telehealth (HOSPITAL_COMMUNITY): Payer: Self-pay | Admitting: *Deleted

## 2021-09-01 NOTE — Telephone Encounter (Signed)
Patient given detailed instructions per Myocardial Perfusion Study Information Sheet for the test on 09/03/21 Patient notified to arrive 15 minutes early and that it is imperative to arrive on time for appointment to keep from having the test rescheduled.  If you need to cancel or reschedule your appointment, please call the office within 24 hours of your appointment. . Patient verbalized understanding. Kirstie Peri

## 2021-09-03 ENCOUNTER — Ambulatory Visit (HOSPITAL_COMMUNITY): Payer: PPO | Attending: Cardiology

## 2021-09-03 DIAGNOSIS — R0609 Other forms of dyspnea: Secondary | ICD-10-CM | POA: Diagnosis not present

## 2021-09-03 LAB — MYOCARDIAL PERFUSION IMAGING
Estimated workload: 7
Exercise duration (min): 5 min
Exercise duration (sec): 15 s
LV dias vol: 78 mL (ref 46–106)
LV sys vol: 37 mL
MPHR: 154 {beats}/min
Nuc Stress EF: 52 %
Peak HR: 141 {beats}/min
Percent HR: 91 %
Rest HR: 81 {beats}/min
Rest Nuclear Isotope Dose: 10.9 mCi
SDS: 0
SRS: 0
SSS: 0
Stress Nuclear Isotope Dose: 30.3 mCi
TID: 1

## 2021-09-03 MED ORDER — TECHNETIUM TC 99M TETROFOSMIN IV KIT
10.9000 | PACK | Freq: Once | INTRAVENOUS | Status: AC | PRN
  Administered 2021-09-03: 10.9 via INTRAVENOUS

## 2021-09-03 MED ORDER — TECHNETIUM TC 99M TETROFOSMIN IV KIT
30.3000 | PACK | Freq: Once | INTRAVENOUS | Status: AC | PRN
  Administered 2021-09-03: 30.3 via INTRAVENOUS

## 2021-10-19 ENCOUNTER — Other Ambulatory Visit: Payer: Self-pay | Admitting: Adult Health

## 2021-11-26 DIAGNOSIS — R030 Elevated blood-pressure reading, without diagnosis of hypertension: Secondary | ICD-10-CM | POA: Diagnosis not present

## 2021-11-26 DIAGNOSIS — Z6826 Body mass index (BMI) 26.0-26.9, adult: Secondary | ICD-10-CM | POA: Diagnosis not present

## 2021-11-26 DIAGNOSIS — J329 Chronic sinusitis, unspecified: Secondary | ICD-10-CM | POA: Diagnosis not present

## 2021-11-26 DIAGNOSIS — Z20828 Contact with and (suspected) exposure to other viral communicable diseases: Secondary | ICD-10-CM | POA: Diagnosis not present

## 2021-11-26 DIAGNOSIS — J40 Bronchitis, not specified as acute or chronic: Secondary | ICD-10-CM | POA: Diagnosis not present

## 2022-01-07 DIAGNOSIS — E890 Postprocedural hypothyroidism: Secondary | ICD-10-CM | POA: Diagnosis not present

## 2022-01-08 LAB — COMPREHENSIVE METABOLIC PANEL
ALT: 10 IU/L (ref 0–32)
AST: 18 IU/L (ref 0–40)
Albumin/Globulin Ratio: 2.1 (ref 1.2–2.2)
Albumin: 4.5 g/dL (ref 3.9–4.9)
Alkaline Phosphatase: 64 IU/L (ref 44–121)
BUN/Creatinine Ratio: 12 (ref 12–28)
BUN: 9 mg/dL (ref 8–27)
Bilirubin Total: 0.4 mg/dL (ref 0.0–1.2)
CO2: 25 mmol/L (ref 20–29)
Calcium: 8.8 mg/dL (ref 8.7–10.3)
Chloride: 101 mmol/L (ref 96–106)
Creatinine, Ser: 0.78 mg/dL (ref 0.57–1.00)
Globulin, Total: 2.1 g/dL (ref 1.5–4.5)
Glucose: 91 mg/dL (ref 70–99)
Potassium: 4.1 mmol/L (ref 3.5–5.2)
Sodium: 139 mmol/L (ref 134–144)
Total Protein: 6.6 g/dL (ref 6.0–8.5)
eGFR: 84 mL/min/{1.73_m2} (ref >59)

## 2022-01-08 LAB — LIPID PANEL
Chol/HDL Ratio: 5 ratio — ABNORMAL HIGH (ref 0.0–4.4)
Cholesterol, Total: 262 mg/dL — ABNORMAL HIGH (ref 100–199)
HDL: 52 mg/dL (ref >39)
LDL Chol Calc (NIH): 179 mg/dL — ABNORMAL HIGH (ref 0–99)
Triglycerides: 170 mg/dL — ABNORMAL HIGH (ref 0–149)
VLDL Cholesterol Cal: 31 mg/dL (ref 5–40)

## 2022-01-08 LAB — TSH: TSH: 0.602 u[IU]/mL (ref 0.450–4.500)

## 2022-01-08 LAB — T4, FREE: Free T4: 1.42 ng/dL (ref 0.82–1.77)

## 2022-01-14 ENCOUNTER — Encounter: Payer: Self-pay | Admitting: "Endocrinology

## 2022-01-14 ENCOUNTER — Ambulatory Visit: Payer: PPO | Admitting: "Endocrinology

## 2022-01-14 VITALS — BP 102/64 | HR 68 | Ht 69.0 in | Wt 172.2 lb

## 2022-01-14 DIAGNOSIS — E782 Mixed hyperlipidemia: Secondary | ICD-10-CM

## 2022-01-14 DIAGNOSIS — E890 Postprocedural hypothyroidism: Secondary | ICD-10-CM

## 2022-01-14 MED ORDER — ROSUVASTATIN CALCIUM 10 MG PO TABS: 10.0000 mg | ORAL_TABLET | Freq: Every evening | ORAL | 1 refills | Status: DC

## 2022-01-14 NOTE — Progress Notes (Signed)
01/14/2022      Endocrinology follow-up note   Subjective:    Patient ID: Colleen Torres, female    DOB: 1956/01/06, PCP Sasser, Silvestre Moment, MD   Past Medical History:  Diagnosis Date   Abdominal pain, RLQ 09/26/2015   Arthritis    Bilateral Hands   Cancer ([place])    Follicular neoplasm of thyroid   Depression    Dysrhythmia    GERD (gastroesophageal reflux disease)    Hypothyroidism    Past Surgical History:  Procedure Laterality Date   APPENDECTOMY     CYST REMOVAL NECK     SUPRAVENTRICULAR TACHYCARDIA ABLATION  09/2002   THYROIDECTOMY Right 03/19/2014   Procedure: RIGHT THYROID LOBECTOMY;  Surgeon: Jamesetta So, MD;  Location: [facility];  Service: General;  Laterality: Right;   THYROIDECTOMY, PARTIAL Left 2011   TUBAL LIGATION      Family History  Problem Relation Age of Onset   Depression Father    Anxiety disorder Father    Other Mother        hip replacement; Torres stone   Depression Brother    Anxiety disorder Brother    Miscarriages / Stillbirths Daughter    Macular degeneration Maternal Grandmother    Heart disease Maternal Grandmother    Social History   Socioeconomic History   Marital status: Married    Spouse name: Not on file   Number of children: 1   Years of education: Not on file   Highest education level: Not on file  Occupational History   Not on file  Tobacco Use   Smoking status: Never   Smokeless tobacco: Never  Vaping Use   Vaping Use: Never used  Substance and Sexual Activity   Alcohol use: No   Drug use: No   Sexual activity: Yes    Birth control/protection: Surgical    Comment: tubal  Other Topics Concern   Not on file  Social History Narrative   Not on file   Social Determinants of Health   Financial Resource Strain: Not on file  Food Insecurity: Not on file  Transportation Needs: Not on file  Physical Activity: Not on file  Stress: Not on file  Social Connections: Not on file   Outpatient Encounter Medications as of  01/14/2022  Medication Sig   rosuvastatin (CRESTOR) 10 MG tablet Take 1 tablet (10 mg total) by mouth at bedtime.   levothyroxine (SYNTHROID) 112 MCG tablet Take 1 tablet (112 mcg total) by mouth daily.   Multiple Vitamins-Minerals (EYE VITAMINS PO) Take 2 capsules by mouth daily.    sertraline (ZOLOFT) 25 MG tablet TAKE 1 TABLET BY MOUTH ONCE A DAY.   [DISCONTINUED] Black Elderberry (SAMBUCUS ELDERBERRY PO) 1,250 mg.   [DISCONTINUED] Multiple Vitamins-Minerals (CENTRUM SILVER 50+WOMEN PO) Take 1 tablet by mouth 2 (two) times daily.   No facility-administered encounter medications on file as of 01/14/2022.   ALLERGIES: No Known Allergies VACCINATION STATUS: Immunization History  Administered Date(s) Administered   Influenza,inj,Quad PF,6+ Mos 03/20/2014    HPI  66 year old female with medical history as above.   She is being seen for follow-up of her postsurgical hypothyroidism, hypocalcemia, osteopenia.  Her previsit labs show severe dyslipidemia.    -  She is currently on levothyroxine 112 mcg p.o. daily before breakfast.  She reports compliance.  She has adopted whole food plant-based diet and achieving remarkable control on her weight, presents with 33 pounds weight loss since last visit.    Her previsit  thyroid function tests are consistent with appropriate replacement. -For mild postsurgical hypocalcemia, she has taken herself off of calcium supplements, remains on multivitamin daily supplements.   Her recent labs show normal calcium-8.8 mg per DL. Her recent bone density was consistent with stable/slight improvement compared to 2021 studies.    See below. She has had history of multinodular goiter , on follow-up ultrasound guided FNA which showed FLUS, and underwent completion thyroidectomy with benign outcomes- December 2015.  She underwent her first  left hemithyroidectomy in 2000 for MNG, reportedly benign.      - she denies dysphagia, SOB. -She has steady weight since last  visit. -She underwent bone density study which showed osteopenia.  Her bone density is scheduled to be done on July 17, 2019.  Review of Systems Limited as above.  Objective:    BP 102/64   Pulse 68   Ht 5' 9"  (1.753 m)   Wt 172 lb 3.2 oz (78.1 kg)   BMI 25.43 kg/m   Wt Readings from Last 3 Encounters:  01/14/22 172 lb 3.2 oz (78.1 kg)  09/03/21 198 lb (89.8 kg)  08/29/21 198 lb 6.4 oz (90 kg)       Recent Results (from the past 2160 hour(s))  TSH     Status: None   Collection Time: 01/07/22  8:55 AM  Result Value Ref Range   TSH 0.602 0.450 - 4.500 uIU/mL  T4, free     Status: None   Collection Time: 01/07/22  8:55 AM  Result Value Ref Range   Free T4 1.42 0.82 - 1.77 ng/dL  Comprehensive metabolic panel     Status: None   Collection Time: 01/07/22  8:55 AM  Result Value Ref Range   Glucose 91 70 - 99 mg/dL   BUN 9 8 - 27 mg/dL   Creatinine, Ser 0.78 0.57 - 1.00 mg/dL   eGFR 84 >59 mL/min/1.73   BUN/Creatinine Ratio 12 12 - 28   Sodium 139 134 - 144 mmol/L   Potassium 4.1 3.5 - 5.2 mmol/L   Chloride 101 96 - 106 mmol/L   CO2 25 20 - 29 mmol/L   Calcium 8.8 8.7 - 10.3 mg/dL   Total Protein 6.6 6.0 - 8.5 g/dL   Albumin 4.5 3.9 - 4.9 g/dL   Globulin, Total 2.1 1.5 - 4.5 g/dL   Albumin/Globulin Ratio 2.1 1.2 - 2.2   Bilirubin Total 0.4 0.0 - 1.2 mg/dL   Alkaline Phosphatase 64 44 - 121 IU/L   AST 18 0 - 40 IU/L   ALT 10 0 - 32 IU/L  Lipid panel     Status: Abnormal   Collection Time: 01/07/22  8:55 AM  Result Value Ref Range   Cholesterol, Total 262 (H) 100 - 199 mg/dL   Triglycerides 170 (H) 0 - 149 mg/dL   HDL 52 >39 mg/dL   VLDL Cholesterol Cal 31 5 - 40 mg/dL   LDL Chol Calc (NIH) 179 (H) 0 - 99 mg/dL   Chol/HDL Ratio 5.0 (H) 0.0 - 4.4 ratio    Comment:                                   T. Chol/HDL Ratio  Men  Women                               1/2 Avg.Risk  3.4    3.3                                    Avg.Risk  5.0    4.4                                2X Avg.Risk  9.6    7.1                                3X Avg.Risk 23.4   11.0    Bone density from July 21, 2021  AP Spine L1-L4 07/12/2017 61.9 Osteopenia -1.3 1.021 g/cm2  DualFemur Neck Right 07/12/2017 61.9 Osteopenia -1.9 0.774 g/cm2 DualFemur Total Mean 07/12/2017 61.9 Osteopenia -1.3 0.850 g/cm2 ASSESSMENT: BMD as determined from Femur Neck Right is 0.774 g/cm2 with a T-Score of -1.9. This patient is considered osteopenic according to [facility]) criteria. AP Spine L1-L3 07/21/2021 66.0 Osteopenia -1.2 1.024 g/cm2 2.6% - AP Spine L1-L3 07/17/2019 64.0 Osteopenia -1.4 0.998 g/cm2 -1.6% - AP Spine L1-L3 07/12/2017 61.9 Osteopenia -1.3 1.014 g/cm2 - -   DualFemur Neck Right 07/21/2021 66.0 Osteopenia -1.8 0.787 g/cm2 2.1% - DualFemur Neck Right 07/17/2019 64.0 Osteopenia -1.9 0.771 g/cm2 -0.4% - DualFemur Neck Right 07/12/2017 61.9 Osteopenia -1.9 0.774 g/cm2 - -   DualFemur Total Mean 07/21/2021 66.0 Osteopenia -1.3 0.849 g/cm2 -0.5% - DualFemur Total Mean 07/17/2019 64.0 Osteopenia -1.2 0.853 g/cm2 0.4% - DualFemur Total Mean 07/12/2017 61.9 Osteopenia -1.3 0.850 g/cm2 - -    Assessment & Plan:   1. Postsurgical hypothyroidism She has history of multinodular goiter. She underwent remnant thyroidectomy on 03/19/14, no malignancy. Her previsit thyroid function tests are consistent with appropriate replacement.  She is advised to continue levothyroxine 112 mcg p.o. daily before breakfast.     - We discussed about the correct intake of her thyroid hormone, on empty stomach at fasting, with water, separated by at least 30 minutes from breakfast and other medications,  and separated by more than 4 hours from calcium, iron, multivitamins, acid reflux medications (PPIs). -Patient is made aware of the fact that thyroid hormone replacement is needed for life, dose to be adjusted by periodic monitoring of  thyroid function tests.    2. Hypocalcemia -Her recent calcium is 8.8 when she is only on multivitamin  supplements with some calcium content.  She is advised to continue.    3.  Osteopenia:  She has family history of osteoporosis.  Her recent bone density shows slight improvement compared to her last study in 2021.  She will not need bisphosphonate therapy. -Her FRAX fracture risk calculation reveals 10-year probability of major osteoporosis-related fracture is 10%, while her risk for hip fracture is close to 0%.   She is advised to continue vitamin D and calcium supplements.   Her next bone density will be due in April 2025.  4.  Hyperlipidemia Her previsit labs show LDL of 179, total cholesterol 262, triglycerides 170.  We do not have a previous reference to compare.  She is advised to continue her lifestyle medicine journey.  She will also be initiated on Crestor 10 mg p.o. nightly. - she acknowledges that there is a room for improvement in her food and drink choices. - Suggestion is made for her to avoid simple carbohydrates  from her diet including Cakes, Sweet Desserts, Ice Cream, Soda (diet and regular), Sweet Tea, Candies, Chips, Cookies, Store Bought Juices, Alcohol , Artificial Sweeteners,  Coffee Creamer, and "Sugar-free" Products, Lemonade. This will help patient to have more stable blood glucose profile and potentially avoid unintended weight gain.  The following Lifestyle Medicine recommendations according to [facility]) were discussed and and offered to patient and she  agrees to start the journey:  A. Whole Foods, Plant-Based Nutrition comprising of fruits and vegetables, plant-based proteins, whole-grain carbohydrates was discussed in detail with the patient.   A list for source of those nutrients were also provided to the patient.  Patient will use only water or unsweetened tea for hydration. B.  The need to stay away from risky substances  including alcohol, smoking; obtaining 7 to 9 hours of restorative sleep, at least 150 minutes of moderate intensity exercise weekly, the importance of healthy social connections,  and stress management techniques were discussed. C.  A full color page of  Calorie density of various food groups per pound showing examples of each food groups was provided to the patient.   - I advised patient to maintain close follow up at [place]  for primary care needs.   I spent 33 minutes in the care of the patient today including review of labs from Thyroid Function, CMP, and other relevant labs ; imaging/biopsy records (current and previous including abstractions from other facilities); face-to-face time discussing  her lab results and symptoms, medications doses, her options of short and long term treatment based on the latest standards of care / guidelines;   and documenting the encounter.  Colleen Torres  participated in the discussions, expressed understanding, and voiced agreement with the above plans.  All questions were answered to her satisfaction. she is encouraged to contact clinic should she have any questions or concerns prior to her return visit.     Follow up plan: Return in about 3 months (around 04/16/2022) for Fasting Labs  in AM B4 8.  Glade Lloyd, MD Phone: [phone]  Fax: [phone]  -  This note was partially dictated with voice recognition software. Similar sounding words can be transcribed inadequately or may not  be corrected upon review.  01/14/2022, 12:45 PM

## 2022-01-18 ENCOUNTER — Other Ambulatory Visit: Payer: Self-pay | Admitting: Adult Health

## 2022-02-03 DIAGNOSIS — Z23 Encounter for immunization: Secondary | ICD-10-CM | POA: Diagnosis not present

## 2022-02-05 DIAGNOSIS — Z23 Encounter for immunization: Secondary | ICD-10-CM | POA: Diagnosis not present

## 2022-02-15 ENCOUNTER — Other Ambulatory Visit: Payer: Self-pay | Admitting: "Endocrinology

## 2022-02-22 ENCOUNTER — Other Ambulatory Visit: Payer: Self-pay | Admitting: "Endocrinology

## 2022-04-10 DIAGNOSIS — E782 Mixed hyperlipidemia: Secondary | ICD-10-CM | POA: Diagnosis not present

## 2022-04-11 LAB — LIPID PANEL
Chol/HDL Ratio: 2.7 ratio (ref 0.0–4.4)
Cholesterol, Total: 164 mg/dL (ref 100–199)
HDL: 60 mg/dL (ref >39)
LDL Chol Calc (NIH): 80 mg/dL (ref 0–99)
Triglycerides: 137 mg/dL (ref 0–149)
VLDL Cholesterol Cal: 24 mg/dL (ref 5–40)

## 2022-04-12 ENCOUNTER — Other Ambulatory Visit: Payer: Self-pay | Admitting: Adult Health

## 2022-04-14 DIAGNOSIS — Z1283 Encounter for screening for malignant neoplasm of skin: Secondary | ICD-10-CM | POA: Diagnosis not present

## 2022-04-14 DIAGNOSIS — B351 Tinea unguium: Secondary | ICD-10-CM | POA: Diagnosis not present

## 2022-04-14 DIAGNOSIS — D239 Other benign neoplasm of skin, unspecified: Secondary | ICD-10-CM | POA: Diagnosis not present

## 2022-04-16 ENCOUNTER — Encounter: Payer: Self-pay | Admitting: "Endocrinology

## 2022-04-16 ENCOUNTER — Ambulatory Visit (INDEPENDENT_AMBULATORY_CARE_PROVIDER_SITE_OTHER): Payer: PPO | Admitting: "Endocrinology

## 2022-04-16 VITALS — BP 94/76 | HR 76 | Ht 69.0 in | Wt 169.4 lb

## 2022-04-16 DIAGNOSIS — M8589 Other specified disorders of bone density and structure, multiple sites: Secondary | ICD-10-CM | POA: Diagnosis not present

## 2022-04-16 DIAGNOSIS — E782 Mixed hyperlipidemia: Secondary | ICD-10-CM | POA: Diagnosis not present

## 2022-04-16 DIAGNOSIS — E890 Postprocedural hypothyroidism: Secondary | ICD-10-CM | POA: Diagnosis not present

## 2022-04-16 MED ORDER — ROSUVASTATIN CALCIUM 10 MG PO TABS: 10.0000 mg | ORAL_TABLET | ORAL | 1 refills | Status: DC

## 2022-04-16 NOTE — Progress Notes (Signed)
04/16/2022      Endocrinology follow-up note   Subjective:    Patient ID: Colleen Torres, female    DOB: 09-14-1955, PCP Sasser, Silvestre Moment, MD   Past Medical History:  Diagnosis Date   Abdominal pain, RLQ 09/26/2015   Arthritis    Bilateral Hands   Cancer ([place])    Follicular neoplasm of thyroid   Depression    Dysrhythmia    GERD (gastroesophageal reflux disease)    Hypothyroidism    Past Surgical History:  Procedure Laterality Date   APPENDECTOMY     CYST REMOVAL NECK     SUPRAVENTRICULAR TACHYCARDIA ABLATION  09/2002   THYROIDECTOMY Right 03/19/2014   Procedure: RIGHT THYROID LOBECTOMY;  Surgeon: Jamesetta So, MD;  Location: [facility];  Service: General;  Laterality: Right;   THYROIDECTOMY, PARTIAL Left 2011   TUBAL LIGATION      Family History  Problem Relation Age of Onset   Depression Father    Anxiety disorder Father    Other Mother        hip replacement; Torres stone   Depression Brother    Anxiety disorder Brother    Miscarriages / Stillbirths Daughter    Macular degeneration Maternal Grandmother    Heart disease Maternal Grandmother    Social History   Socioeconomic History   Marital status: Married    Spouse name: Not on file   Number of children: 1   Years of education: Not on file   Highest education level: Not on file  Occupational History   Not on file  Tobacco Use   Smoking status: Never   Smokeless tobacco: Never  Vaping Use   Vaping Use: Never used  Substance and Sexual Activity   Alcohol use: No   Drug use: No   Sexual activity: Yes    Birth control/protection: Surgical    Comment: tubal  Other Topics Concern   Not on file  Social History Narrative   Not on file   Social Determinants of Health   Financial Resource Strain: Not on file  Food Insecurity: Not on file  Transportation Needs: Not on file  Physical Activity: Not on file  Stress: Not on file  Social Connections: Not on file   Outpatient Encounter Medications as of  04/16/2022  Medication Sig   levothyroxine (SYNTHROID) 112 MCG tablet TAKE (1) TABLET BY MOUTH ONCE DAILY.   Multiple Vitamins-Minerals (EYE VITAMINS PO) Take 2 capsules by mouth daily.    rosuvastatin (CRESTOR) 10 MG tablet Take 1 tablet (10 mg total) by mouth every other day.   sertraline (ZOLOFT) 25 MG tablet TAKE 1 TABLET BY MOUTH ONCE A DAY.   [DISCONTINUED] rosuvastatin (CRESTOR) 10 MG tablet Take 1 tablet (10 mg total) by mouth at bedtime.   No facility-administered encounter medications on file as of 04/16/2022.   ALLERGIES: No Known Allergies VACCINATION STATUS: Immunization History  Administered Date(s) Administered   Influenza,inj,Quad PF,6+ Mos 03/20/2014    HPI  67 year old female with medical history as above.   She is being seen for follow-up of her postsurgical hypothyroidism, hypocalcemia, osteopenia.  Her previsit labs show severe dyslipidemia.    -  She is currently on levothyroxine 112 mcg p.o. daily before breakfast.  She reports compliance.  She remains on whole food plant-based diet which helped her lose 35 pounds overall.  Most importantly, she has lowered her LDL from 179 to 80.      Her previsit thyroid function tests are consistent with  appropriate replacement. -For mild postsurgical hypocalcemia, she has taken herself off of calcium supplements, remains on multivitamin daily supplements.   Her recent labs show normal calcium-8.8 mg per DL. Her recent bone density was consistent with stable/slight improvement compared to 2021 studies.    See below. She has had history of multinodular goiter , on follow-up ultrasound guided FNA which showed FLUS, and underwent completion thyroidectomy with benign outcomes- December 2015.  She underwent her first  left hemithyroidectomy in 2000 for MNG, reportedly benign.      - she denies dysphagia, SOB. -She has steady weight since last visit. -She underwent bone density study which showed osteopenia.  Her bone density is  scheduled to be done on July 17, 2019.  Review of Systems Limited as above.  Objective:    BP 94/76   Pulse 76   Ht '5\' 9"'$  (1.753 m)   Wt 169 lb 6.4 oz (76.8 kg)   BMI 25.02 kg/m   Wt Readings from Last 3 Encounters:  04/16/22 169 lb 6.4 oz (76.8 kg)  01/14/22 172 lb 3.2 oz (78.1 kg)  09/03/21 198 lb (89.8 kg)       Recent Results (from the past 2160 hour(s))  Lipid panel     Status: None   Collection Time: 04/10/22  8:51 AM  Result Value Ref Range   Cholesterol, Total 164 100 - 199 mg/dL   Triglycerides 137 0 - 149 mg/dL   HDL 60 >39 mg/dL   VLDL Cholesterol Cal 24 5 - 40 mg/dL   LDL Chol Calc (NIH) 80 0 - 99 mg/dL   Chol/HDL Ratio 2.7 0.0 - 4.4 ratio    Comment:                                   T. Chol/HDL Ratio                                             Men  Women                               1/2 Avg.Risk  3.4    3.3                                   Avg.Risk  5.0    4.4                                2X Avg.Risk  9.6    7.1                                3X Avg.Risk 23.4   11.0    Bone density from July 21, 2021  AP Spine L1-L4 07/12/2017 61.9 Osteopenia -1.3 1.021 g/cm2  DualFemur Neck Right 07/12/2017 61.9 Osteopenia -1.9 0.774 g/cm2 DualFemur Total Mean 07/12/2017 61.9 Osteopenia -1.3 0.850 g/cm2 ASSESSMENT: BMD as determined from Femur Neck Right is 0.774 g/cm2 with a T-Score of -1.9. This patient is considered osteopenic according to [facility]) criteria. AP Spine L1-L3 07/21/2021 66.0 Osteopenia -1.2 1.024  g/cm2 2.6% - AP Spine L1-L3 07/17/2019 64.0 Osteopenia -1.4 0.998 g/cm2 -1.6% - AP Spine L1-L3 07/12/2017 61.9 Osteopenia -1.3 1.014 g/cm2 - -   DualFemur Neck Right 07/21/2021 66.0 Osteopenia -1.8 0.787 g/cm2 2.1% - DualFemur Neck Right 07/17/2019 64.0 Osteopenia -1.9 0.771 g/cm2 -0.4% - DualFemur Neck Right 07/12/2017 61.9 Osteopenia -1.9 0.774 g/cm2 - -   DualFemur Total Mean 07/21/2021 66.0 Osteopenia -1.3 0.849  g/cm2 -0.5% - DualFemur Total Mean 07/17/2019 64.0 Osteopenia -1.2 0.853 g/cm2 0.4% - DualFemur Total Mean 07/12/2017 61.9 Osteopenia -1.3 0.850 g/cm2 - -    Assessment & Plan:   1. Postsurgical hypothyroidism She has history of multinodular goiter. She underwent remnant thyroidectomy on 03/19/14, no malignancy. Her previsit thyroid function tests are consistent with appropriate replacement.  She is advised to continue levothyroxine 112 mcg p.o. daily before breakfast.     - We discussed about the correct intake of her thyroid hormone, on empty stomach at fasting, with water, separated by at least 30 minutes from breakfast and other medications,  and separated by more than 4 hours from calcium, iron, multivitamins, acid reflux medications (PPIs). -Patient is made aware of the fact that thyroid hormone replacement is needed for life, dose to be adjusted by periodic monitoring of thyroid function tests.   2. Hypocalcemia -Her recent calcium is 8.8 when she is only on multivitamin  supplements with some calcium content.  She is advised to continue.    3.  Osteopenia:  She has family history of osteoporosis.  Her recent bone density shows slight improvement compared to her last study in 2021.  She will not need bisphosphonate therapy. -Her FRAX fracture risk calculation reveals 10-year probability of major osteoporosis-related fracture is 10%, while her risk for hip fracture is close to 0%.   She is advised to continue vitamin D and calcium supplements.   Her next bone density will be due in April 2025.  4.  Hyperlipidemia Her previsit labs show LDL of 80, improving from 179, total cholesterol 164 improving from 262, triglycerides 137 improving from 170.  Her HDL has increased from 5 to  60.    She is advised to continue her lifestyle medicine journey.  She is advised to continue Crestor 10 mg p.o. every other night.   - she acknowledges that there is a room for improvement in her food and  drink choices. - Suggestion is made for her to avoid simple carbohydrates  from her diet including Cakes, Sweet Desserts, Ice Cream, Soda (diet and regular), Sweet Tea, Candies, Chips, Cookies, Store Bought Juices, Alcohol , Artificial Sweeteners,  Coffee Creamer, and "Sugar-free" Products, Lemonade. This will help patient to have more stable blood glucose profile and potentially avoid unintended weight gain.  The following Lifestyle Medicine recommendations according to [facility]) were discussed and and offered to patient and she  agrees to start the journey:  A. Whole Foods, Plant-Based Nutrition comprising of fruits and vegetables, plant-based proteins, whole-grain carbohydrates was discussed in detail with the patient.   A list for source of those nutrients were also provided to the patient.  Patient will use only water or unsweetened tea for hydration. B.  The need to stay away from risky substances including alcohol, smoking; obtaining 7 to 9 hours of restorative sleep, at least 150 minutes of moderate intensity exercise weekly, the importance of healthy social connections,  and stress management techniques were discussed. C.  A full color page of  Calorie density  of various food groups per pound showing examples of each food groups was provided to the patient.   - I advised patient to maintain close follow up at [place]  for primary care needs.   I spent 26 minutes in the care of the patient today including review of labs from Thyroid Function, CMP, and other relevant labs ; imaging/biopsy records (current and previous including abstractions from other facilities); face-to-face time discussing  her lab results and symptoms, medications doses, her options of short and long term treatment based on the latest standards of care / guidelines;   and documenting the encounter.  Colleen Torres  participated in the discussions, expressed  understanding, and voiced agreement with the above plans.  All questions were answered to her satisfaction. she is encouraged to contact clinic should she have any questions or concerns prior to her return visit.    Follow up plan: Return in about 6 months (around 10/15/2022) for Fasting Labs  in AM B4 8.  Glade Lloyd, MD Phone: [phone]  Fax: [phone]  -  This note was partially dictated with voice recognition software. Similar sounding words can be transcribed inadequately or may not  be corrected upon review.  04/16/2022, 5:34 PM

## 2022-08-02 ENCOUNTER — Other Ambulatory Visit: Payer: Self-pay | Admitting: Adult Health

## 2022-08-04 DIAGNOSIS — Z1231 Encounter for screening mammogram for malignant neoplasm of breast: Secondary | ICD-10-CM | POA: Diagnosis not present

## 2022-08-16 ENCOUNTER — Other Ambulatory Visit: Payer: Self-pay | Admitting: Nurse Practitioner

## 2022-08-18 ENCOUNTER — Ambulatory Visit: Payer: PPO | Admitting: Obstetrics & Gynecology

## 2022-08-24 DIAGNOSIS — S93402A Sprain of unspecified ligament of left ankle, initial encounter: Secondary | ICD-10-CM | POA: Diagnosis not present

## 2022-08-24 DIAGNOSIS — R030 Elevated blood-pressure reading, without diagnosis of hypertension: Secondary | ICD-10-CM | POA: Diagnosis not present

## 2022-09-08 DIAGNOSIS — R030 Elevated blood-pressure reading, without diagnosis of hypertension: Secondary | ICD-10-CM | POA: Diagnosis not present

## 2022-09-08 DIAGNOSIS — M25572 Pain in left ankle and joints of left foot: Secondary | ICD-10-CM | POA: Diagnosis not present

## 2022-09-08 DIAGNOSIS — S82892D Other fracture of left lower leg, subsequent encounter for closed fracture with routine healing: Secondary | ICD-10-CM | POA: Diagnosis not present

## 2022-09-08 DIAGNOSIS — S93402A Sprain of unspecified ligament of left ankle, initial encounter: Secondary | ICD-10-CM | POA: Diagnosis not present

## 2022-09-22 DIAGNOSIS — S82892D Other fracture of left lower leg, subsequent encounter for closed fracture with routine healing: Secondary | ICD-10-CM | POA: Diagnosis not present

## 2022-09-29 ENCOUNTER — Encounter: Payer: Self-pay | Admitting: Adult Health

## 2022-09-29 ENCOUNTER — Ambulatory Visit (INDEPENDENT_AMBULATORY_CARE_PROVIDER_SITE_OTHER): Payer: PPO | Admitting: Adult Health

## 2022-09-29 ENCOUNTER — Other Ambulatory Visit (HOSPITAL_COMMUNITY)
Admission: RE | Admit: 2022-09-29 | Discharge: 2022-09-29 | Disposition: A | Discharge status: Home / Self Care | Payer: PPO | Source: Ambulatory Visit | Attending: Obstetrics & Gynecology | Admitting: Obstetrics & Gynecology

## 2022-09-29 VITALS — BP 107/65 | HR 71 | Ht 69.5 in | Wt 180.0 lb

## 2022-09-29 DIAGNOSIS — Z1151 Encounter for screening for human papillomavirus (HPV): Secondary | ICD-10-CM | POA: Diagnosis not present

## 2022-09-29 DIAGNOSIS — F32A Depression, unspecified: Secondary | ICD-10-CM

## 2022-09-29 DIAGNOSIS — Z780 Asymptomatic menopausal state: Secondary | ICD-10-CM | POA: Diagnosis not present

## 2022-09-29 DIAGNOSIS — Z01419 Encounter for gynecological examination (general) (routine) without abnormal findings: Secondary | ICD-10-CM | POA: Insufficient documentation

## 2022-09-29 DIAGNOSIS — E890 Postprocedural hypothyroidism: Secondary | ICD-10-CM | POA: Diagnosis not present

## 2022-09-29 DIAGNOSIS — R61 Generalized hyperhidrosis: Secondary | ICD-10-CM

## 2022-09-29 DIAGNOSIS — Z1211 Encounter for screening for malignant neoplasm of colon: Secondary | ICD-10-CM | POA: Diagnosis not present

## 2022-09-29 LAB — HEMOCCULT GUIAC POC 1CARD (OFFICE): Fecal Occult Blood, POC: NEGATIVE

## 2022-09-29 MED ORDER — SERTRALINE HCL 25 MG PO TABS: 25.0000 mg | ORAL_TABLET | Freq: Every day | ORAL | 3 refills | Status: DC

## 2022-09-29 NOTE — Progress Notes (Signed)
Patient ID: LEIGHANA PATIN, female   DOB: Aug 12, 1955, 67 y.o.   MRN: [id number] History of Present Illness: Nyliyah is a 67 year old white female, married, PM in for a well woman gyn exam and pap.  PCP is Dr Neita Carp   Current Medications, Allergies, Past Medical History, Past Surgical History, Family History and Social History were reviewed in [employer] electronic medical record.     Review of Systems: Patient denies any headaches, hearing loss, fatigue, blurred vision, shortness of breath, chest pain, abdominal pain, problems with bowel movements, urination, or intercourse(not active). No joint pain or mood swings.  Has choking sensation at times, but allergy meds help Denies any vaginal bleeding +sweats a lot   Physical Exam:BP 107/65 (BP Location: Left Arm, Patient Position: Sitting, Cuff Size: Normal)   Pulse 71   Ht 5' 9.5" (1.765 m)   Wt 180 lb (81.6 kg)   BMI 26.20 kg/m   General:  Well developed, well nourished, no acute distress Skin:  Warm and dry Neck:  Midline trachea, surgically removed thyroid, good ROM, no lymphadenopathy,no carotid bruits heard Lungs; Clear to auscultation bilaterally Breast:  No dominant palpable mass, retraction, or nipple discharge Cardiovascular: Regular rate and rhythm Abdomen:  Soft, non tender, no hepatosplenomegaly Pelvic:  External genitalia is normal in appearance, no lesions.  The vagina is pale. Urethra has no lesions or masses. The cervix is smooth, pap with HR HPV genotyping performed.  Uterus is felt to be normal size, shape, and contour.  No adnexal masses or tenderness noted.Bladder is non tender, no masses felt. Rectal: Good sphincter tone, no polyps, +hemorrhoids felt.  Hemoccult negative. Extremities/musculoskeletal:  No swelling or varicosities noted, no clubbing or cyanosis, has brace left ankle Psych:  No mood changes, alert and cooperative,seems happy AA is 0 Fall risk is moderate    09/29/2022    2:59 PM 03/28/2019     9:33 AM 04/11/2018    8:43 AM  Depression screen PHQ 2/9  Decreased Interest 0 0 1  Down, Depressed, Hopeless 0 0 1  PHQ - 2 Score 0 0 2  Altered sleeping 0 0 1  Tired, decreased energy 1 1 3   Change in appetite 0 0 2  Feeling bad or failure about yourself  0 0 1  Trouble concentrating 1 1 3   Moving slowly or fidgety/restless 0 0 1  Suicidal thoughts 0 0 0  PHQ-9 Score 2 2 13   Difficult doing work/chores  Not difficult at all Somewhat difficult       09/29/2022    2:59 PM  GAD 7 : Generalized Anxiety Score  Nervous, Anxious, on Edge 0  Control/stop worrying 0  Worry too much - different things 0  Trouble relaxing 0  Restless 0  Easily annoyed or irritable 0  Afraid - awful might happen 0  Total GAD 7 Score 0      Upstream - 09/29/22 1506       Pregnancy Intention Screening   Does the patient want to become pregnant in the next year? N/A    Does the patient's partner want to become pregnant in the next year? N/A    Would the patient like to discuss contraceptive options today? N/A      Contraception Wrap Up   Current Method Female Sterilization    End Method Female Sterilization    Contraception Counseling Provided No             Examination chaperoned by Malachy Mood LPN  Impression and Plan: 1. Encounter for gynecological examination with Papanicolaou smear of cervix Pap sent Pap in 3 years if normal Physical in 1 year Labs with Dr Fransico Him Mammogram was negative at [facility] about 2 weeks ago Colonoscopy per GI  - Cytology - PAP( [facility])  2. Encounter for screening fecal occult blood testing Hemoccult was negative  - POCT occult blood stool  3. Postmenopausal Denis any vaginal bleeding   4. Postsurgical hypothyroidism  5. Excessive sweating  6. Depression, unspecified depression type Good on Zoloft  Meds ordered this encounter  Medications   sertraline (ZOLOFT) 25 MG tablet    Sig: Take 1 tablet (25 mg total) by mouth daily.    Dispense:   90 tablet    Refill:  3    Order Specific Question:   Supervising Provider    Answer:   Duane Lope H [[id number]]

## 2022-10-01 LAB — CYTOLOGY - PAP
Comment: NEGATIVE
Diagnosis: NEGATIVE
High risk HPV: NEGATIVE

## 2022-10-06 DIAGNOSIS — S82892D Other fracture of left lower leg, subsequent encounter for closed fracture with routine healing: Secondary | ICD-10-CM | POA: Diagnosis not present

## 2022-10-12 DIAGNOSIS — M8589 Other specified disorders of bone density and structure, multiple sites: Secondary | ICD-10-CM | POA: Diagnosis not present

## 2022-10-12 DIAGNOSIS — E890 Postprocedural hypothyroidism: Secondary | ICD-10-CM | POA: Diagnosis not present

## 2022-10-12 DIAGNOSIS — E782 Mixed hyperlipidemia: Secondary | ICD-10-CM | POA: Diagnosis not present

## 2022-10-13 LAB — COMPREHENSIVE METABOLIC PANEL
ALT: 18 IU/L (ref 0–32)
AST: 19 IU/L (ref 0–40)
Albumin: 4.1 g/dL (ref 3.9–4.9)
Alkaline Phosphatase: 65 IU/L (ref 44–121)
BUN/Creatinine Ratio: 14 (ref 12–28)
BUN: 12 mg/dL (ref 8–27)
Bilirubin Total: 0.5 mg/dL (ref 0.0–1.2)
CO2: 24 mmol/L (ref 20–29)
Calcium: 8.5 mg/dL — ABNORMAL LOW (ref 8.7–10.3)
Chloride: 102 mmol/L (ref 96–106)
Creatinine, Ser: 0.84 mg/dL (ref 0.57–1.00)
Globulin, Total: 2.4 g/dL (ref 1.5–4.5)
Glucose: 92 mg/dL (ref 70–99)
Potassium: 4.2 mmol/L (ref 3.5–5.2)
Sodium: 140 mmol/L (ref 134–144)
Total Protein: 6.5 g/dL (ref 6.0–8.5)
eGFR: 76 mL/min/{1.73_m2} (ref >59)

## 2022-10-13 LAB — TSH: TSH: 0.468 u[IU]/mL (ref 0.450–4.500)

## 2022-10-13 LAB — LIPID PANEL
Chol/HDL Ratio: 3.5 ratio (ref 0.0–4.4)
Cholesterol, Total: 204 mg/dL — ABNORMAL HIGH (ref 100–199)
HDL: 59 mg/dL (ref >39)
LDL Chol Calc (NIH): 118 mg/dL — ABNORMAL HIGH (ref 0–99)
Triglycerides: 154 mg/dL — ABNORMAL HIGH (ref 0–149)
VLDL Cholesterol Cal: 27 mg/dL (ref 5–40)

## 2022-10-13 LAB — T4, FREE: Free T4: 1.5 ng/dL (ref 0.82–1.77)

## 2022-10-13 LAB — VITAMIN D 25 HYDROXY (VIT D DEFICIENCY, FRACTURES): Vit D, 25-Hydroxy: 35.8 ng/mL (ref 30.0–100.0)

## 2022-10-18 ENCOUNTER — Other Ambulatory Visit: Payer: Self-pay | Admitting: "Endocrinology

## 2022-10-19 ENCOUNTER — Ambulatory Visit: Payer: PPO | Admitting: "Endocrinology

## 2022-10-19 ENCOUNTER — Encounter: Payer: Self-pay | Admitting: "Endocrinology

## 2022-10-19 VITALS — BP 102/64 | HR 72 | Ht 69.5 in | Wt 184.0 lb

## 2022-10-19 DIAGNOSIS — E890 Postprocedural hypothyroidism: Secondary | ICD-10-CM | POA: Diagnosis not present

## 2022-10-19 DIAGNOSIS — M8589 Other specified disorders of bone density and structure, multiple sites: Secondary | ICD-10-CM

## 2022-10-19 DIAGNOSIS — E782 Mixed hyperlipidemia: Secondary | ICD-10-CM | POA: Insufficient documentation

## 2022-10-19 MED ORDER — ROSUVASTATIN CALCIUM 5 MG PO TABS: 5.0000 mg | ORAL_TABLET | Freq: Every evening | ORAL | 1 refills | Status: DC

## 2022-10-19 NOTE — Progress Notes (Signed)
10/19/2022      Endocrinology follow-up note   Subjective:    Patient ID: Colleen Torres, female    DOB: 1956-02-23, PCP Sasser, Clarene Critchley, MD   Past Medical History:  Diagnosis Date   Abdominal pain, RLQ 09/26/2015   Arthritis    Bilateral Hands   Cancer (HCC)    Follicular neoplasm of thyroid   Depression    Dysrhythmia    GERD (gastroesophageal reflux disease)    Hypothyroidism    Past Surgical History:  Procedure Laterality Date   APPENDECTOMY     CYST REMOVAL NECK     SUPRAVENTRICULAR TACHYCARDIA ABLATION  09/2002   THYROIDECTOMY Right 03/19/2014   Procedure: RIGHT THYROID LOBECTOMY;  Surgeon: Dalia Heading, MD;  Location: [facility];  Service: General;  Laterality: Right;   THYROIDECTOMY, PARTIAL Left 2011   TUBAL LIGATION      Family History  Problem Relation Age of Onset   Depression Father    Anxiety disorder Father    Other Mother        hip replacement; kidney stone   Depression Brother    Anxiety disorder Brother    Miscarriages / Stillbirths Daughter    Macular degeneration Maternal Grandmother    Heart disease Maternal Grandmother    Social History   Socioeconomic History   Marital status: Married    Spouse name: Not on file   Number of children: 1   Years of education: Not on file   Highest education level: Not on file  Occupational History   Not on file  Tobacco Use   Smoking status: Never   Smokeless tobacco: Never  Vaping Use   Vaping Use: Never used  Substance and Sexual Activity   Alcohol use: No   Drug use: No   Sexual activity: Not Currently    Birth control/protection: Surgical    Comment: tubal  Other Topics Concern   Not on file  Social History Narrative   Not on file   Social Determinants of Health   Financial Resource Strain: Low Risk  (09/29/2022)   Overall Financial Resource Strain (CARDIA)    Difficulty of Paying Living Expenses: Not hard at all  Food Insecurity: No Food Insecurity (09/29/2022)   Hunger Vital Sign     Worried About Running Out of Food in the Last Year: Never true    Ran Out of Food in the Last Year: Never true  Transportation Needs: No Transportation Needs (09/29/2022)   PRAPARE - Administrator, Civil Service (Medical): No    Lack of Transportation (Non-Medical): No  Physical Activity: Inactive (09/29/2022)   Exercise Vital Sign    Days of Exercise per Week: 0 days    Minutes of Exercise per Session: 0 min  Stress: No Stress Concern Present (09/29/2022)   [employer] of Occupational Health - Occupational Stress Questionnaire    Feeling of Stress : Not at all  Social Connections: Moderately Integrated (09/29/2022)   Social Connection and Isolation Panel [NHANES]    Frequency of Communication with Friends and Family: More than three times a week    Frequency of Social Gatherings with Friends and Family: More than three times a week    Attends Religious Services: More than 4 times per year    Active Member of [employer] or Organizations: No    Attends Banker Meetings: Never    Marital Status: Married   Outpatient Encounter Medications as of 10/19/2022  Medication Sig  ibuprofen (ADVIL) 200 MG tablet Take 400 mg by mouth as needed.   levothyroxine (SYNTHROID) 112 MCG tablet TAKE (1) TABLET BY MOUTH ONCE DAILY.   Multiple Vitamins-Minerals (EYE VITAMINS PO) Take 2 capsules by mouth daily.    rosuvastatin (CRESTOR) 5 MG tablet Take 1 tablet (5 mg total) by mouth at bedtime.   sertraline (ZOLOFT) 25 MG tablet Take 1 tablet (25 mg total) by mouth daily.   [DISCONTINUED] rosuvastatin (CRESTOR) 10 MG tablet TAKE (1) TABLET BY MOUTH AT BEDTIME. (Patient taking differently: every other day.)   No facility-administered encounter medications on file as of 10/19/2022.   ALLERGIES: No Known Allergies VACCINATION STATUS: Immunization History  Administered Date(s) Administered   Influenza,inj,Quad PF,6+ Mos 03/20/2014    HPI  67 year old female with medical history as  above.   She is being seen for follow-up of her postsurgical hypothyroidism, hypocalcemia, osteopenia.   -  She is currently on levothyroxine 112 mcg p.o. daily before breakfast.  She reports compliance.   She did have left lower extremity injury which affected her exercise capacity, returns with some weight gain and worsening of her hyperlipidemia.   Her previsit thyroid function tests are consistent with appropriate replacement. Her April 2023  bone density was consistent with stable/slight improvement compared to 2021 studies.    See below. She has had history of multinodular goiter , on follow-up ultrasound guided FNA which showed FLUS, and underwent completion thyroidectomy with benign outcomes- December 2015.  She underwent her first  left hemithyroidectomy in 2000 for MNG, reportedly benign.      - she denies dysphagia, SOB. -She has steady weight since last visit. -She underwent bone density study which showed osteopenia.  Her bone density is scheduled to be done on July 17, 2019.  Review of Systems Limited as above.  Objective:    BP 102/64   Pulse 72   Ht 5' 9.5" (1.765 m)   Wt 184 lb (83.5 kg)   BMI 26.78 kg/m   Wt Readings from Last 3 Encounters:  10/19/22 184 lb (83.5 kg)  09/29/22 180 lb (81.6 kg)  04/16/22 169 lb 6.4 oz (76.8 kg)       Recent Results (from the past 2160 hour(s))  Cytology - PAP( [facility])     Status: None   Collection Time: 09/29/22  3:07 PM  Result Value Ref Range   High risk HPV Negative    Adequacy      Satisfactory for evaluation; transformation zone component PRESENT.   Diagnosis      - Negative for intraepithelial lesion or malignancy (NILM)   Comment Normal Reference Range HPV - Negative   POCT occult blood stool     Status: None   Collection Time: 09/29/22  3:43 PM  Result Value Ref Range   Fecal Occult Blood, POC Negative Negative   Card #1 Date     Card #2 Fecal Occult Blod, POC     Card #2 Date     Card #3 Fecal Occult  Blood, POC     Card #3 Date    Lipid panel     Status: Abnormal   Collection Time: 10/12/22  7:56 AM  Result Value Ref Range   Cholesterol, Total 204 (H) 100 - 199 mg/dL   Triglycerides 956 (H) 0 - 149 mg/dL   HDL 59 >21 mg/dL   VLDL Cholesterol Cal 27 5 - 40 mg/dL   LDL Chol Calc (NIH) 308 (H) 0 - 99 mg/dL  Chol/HDL Ratio 3.5 0.0 - 4.4 ratio    Comment:                                   T. Chol/HDL Ratio                                             Men  Women                               1/2 Avg.Risk  3.4    3.3                                   Avg.Risk  5.0    4.4                                2X Avg.Risk  9.6    7.1                                3X Avg.Risk 23.4   11.0   TSH     Status: None   Collection Time: 10/12/22  7:56 AM  Result Value Ref Range   TSH 0.468 0.450 - 4.500 uIU/mL  T4, free     Status: None   Collection Time: 10/12/22  7:56 AM  Result Value Ref Range   Free T4 1.50 0.82 - 1.77 ng/dL  Comprehensive metabolic panel     Status: Abnormal   Collection Time: 10/12/22  7:56 AM  Result Value Ref Range   Glucose 92 70 - 99 mg/dL   BUN 12 8 - 27 mg/dL   Creatinine, Ser 1.61 0.57 - 1.00 mg/dL   eGFR 76 >09 UE/AVW/0.98   BUN/Creatinine Ratio 14 12 - 28   Sodium 140 134 - 144 mmol/L   Potassium 4.2 3.5 - 5.2 mmol/L   Chloride 102 96 - 106 mmol/L   CO2 24 20 - 29 mmol/L   Calcium 8.5 (L) 8.7 - 10.3 mg/dL   Total Protein 6.5 6.0 - 8.5 g/dL   Albumin 4.1 3.9 - 4.9 g/dL   Globulin, Total 2.4 1.5 - 4.5 g/dL   Bilirubin Total 0.5 0.0 - 1.2 mg/dL   Alkaline Phosphatase 65 44 - 121 IU/L   AST 19 0 - 40 IU/L   ALT 18 0 - 32 IU/L  VITAMIN D 25 Hydroxy (Vit-D Deficiency, Fractures)     Status: None   Collection Time: 10/12/22  7:56 AM  Result Value Ref Range   Vit D, 25-Hydroxy 35.8 30.0 - 100.0 ng/mL    Comment: Vitamin D deficiency has been defined by the Institute of Medicine and an Endocrine Society practice guideline as a level of serum 25-OH vitamin D less  than 20 ng/mL (1,2). The Endocrine Society went on to further define vitamin D insufficiency as a level between 21 and 29 ng/mL (2). 1. IOM (Institute of Medicine). 2010. Dietary reference    intakes for calcium and D. [place]: The    [employer]. 2. Holick MF, Binkley [place], Bischoff-Ferrari HA, et al.  Evaluation, treatment, and prevention of vitamin D    deficiency: an Endocrine Society clinical practice    guideline. JCEM. 2011 Jul; 96(7):1911-30.    Bone density from July 21, 2021   ASSESSMENT: BMD as determined from Femur Neck Right is 0.774 g/cm2 with a T-Score of -1.9. This patient is considered osteopenic according to World Health Organization [facility]) criteria. [facility] Spine L1-L3 07/21/2021 66.0 Osteopenia -1.2 1.024 g/cm2 2.6% - AP Spine L1-L3 07/17/2019 64.0 Osteopenia -1.4 0.998 g/cm2 -1.6% - AP Spine L1-L3 07/12/2017 61.9 Osteopenia -1.3 1.014 g/cm2 - -   DualFemur Neck Right 07/21/2021 66.0 Osteopenia -1.8 0.787 g/cm2 2.1% - DualFemur Neck Right 07/17/2019 64.0 Osteopenia -1.9 0.771 g/cm2 -0.4% - DualFemur Neck Right 07/12/2017 61.9 Osteopenia -1.9 0.774 g/cm2 - -   DualFemur Total Mean 07/21/2021 66.0 Osteopenia -1.3 0.849 g/cm2 -0.5% - DualFemur Total Mean 07/17/2019 64.0 Osteopenia -1.2 0.853 g/cm2 0.4% - DualFemur Total Mean 07/12/2017 61.9 Osteopenia -1.3 0.850 g/cm2 - -    Assessment & Plan:   1. Postsurgical hypothyroidism She has history of multinodular goiter. She underwent remnant thyroidectomy on 03/19/14, no malignancy. Her previsit thyroid function tests are consistent with appropriate replacement.  She is advised to continue levothyroxine 112 mcg p.o. daily before breakfast.     - We discussed about the correct intake of her thyroid hormone, on empty stomach at fasting, with water, separated by at least 30 minutes from breakfast and other medications,  and separated by more than 4 hours from calcium, iron, multivitamins, acid reflux  medications (PPIs). -Patient is made aware of the fact that thyroid hormone replacement is needed for life, dose to be adjusted by periodic monitoring of thyroid function tests.      2. Hypocalcemia -Her recent calcium is 8.5.  She had more stable calcium level when she was taking her multivitamin supplement which she is advised to resume.    3.  Osteopenia:  She has family history of osteoporosis.  Her recent bone density shows slight improvement compared to her last study in 2021.  She will not need bisphosphonate therapy. -Her FRAX fracture risk calculation reveals 10-year probability of major osteoporosis-related fracture is 10%, while her risk for hip fracture is close to 0%.   She is advised to continue vitamin D and calcium supplements.   Her next bone density will be due in April 2025.  4.  Hyperlipidemia Her previsit labs show LDL worsening goal for lipid panel to LDL 118 from 80. -She is advised to continue with Crestor 5 mg nightly.    - she acknowledges that there is a room for improvement in her food and drink choices. - Suggestion is made for her to avoid simple carbohydrates  from her diet including Cakes, Sweet Desserts, Ice Cream, Soda (diet and regular), Sweet Tea, Candies, Chips, Cookies, Store Bought Juices, Alcohol , Artificial Sweeteners,  Coffee Creamer, and "Sugar-free" Products, Lemonade. This will help patient to have more stable blood glucose profile and potentially avoid unintended weight gain.  The following Lifestyle Medicine recommendations according to American College of Lifestyle Medicine  [facility]) were discussed and and offered to patient and she  agrees to start the journey:  A. Whole Foods, Plant-Based Nutrition comprising of fruits and vegetables, plant-based proteins, whole-grain carbohydrates was discussed in detail with the patient.   A list for source of those nutrients were also provided to the patient.  Patient will use only water or unsweetened tea  for hydration. B.  The need to stay away  from risky substances including alcohol, smoking; obtaining 7 to 9 hours of restorative sleep, at least 150 minutes of moderate intensity exercise weekly, the importance of healthy social connections,  and stress management techniques were discussed. C.  A full color page of  Calorie density of various food groups per pound showing examples of each food groups was provided to the patient.    - I advised patient to maintain close follow up at [facility]  for primary care needs.    I spent  25  minutes in the care of the patient today including review of labs from Thyroid Function, CMP, and other relevant labs ; imaging/biopsy records (current and previous including abstractions from other facilities); face-to-face time discussing  her lab results and symptoms, medications doses, her options of short and long term treatment based on the latest standards of care / guidelines;   and documenting the encounter.  Colleen Torres  participated in the discussions, expressed understanding, and voiced agreement with the above plans.  All questions were answered to her satisfaction. she is encouraged to contact clinic should she have any questions or concerns prior to her return visit.    Follow up plan: Return in about 6 months (around 04/21/2023) for Fasting Labs  in AM B4 8.  Marquis Lunch, MD Phone: [phone]  Fax: [phone]  -  This note was partially dictated with voice recognition software. Similar sounding words can be transcribed inadequately or may not  be corrected upon review.  10/19/2022, 1:01 PM

## 2022-11-03 DIAGNOSIS — L608 Other nail disorders: Secondary | ICD-10-CM | POA: Diagnosis not present

## 2022-11-03 DIAGNOSIS — B351 Tinea unguium: Secondary | ICD-10-CM | POA: Diagnosis not present

## 2022-11-03 DIAGNOSIS — Z6826 Body mass index (BMI) 26.0-26.9, adult: Secondary | ICD-10-CM | POA: Diagnosis not present

## 2022-11-03 DIAGNOSIS — S82892D Other fracture of left lower leg, subsequent encounter for closed fracture with routine healing: Secondary | ICD-10-CM | POA: Diagnosis not present

## 2022-11-03 DIAGNOSIS — R030 Elevated blood-pressure reading, without diagnosis of hypertension: Secondary | ICD-10-CM | POA: Diagnosis not present

## 2022-11-27 ENCOUNTER — Ambulatory Visit: Payer: PPO

## 2022-11-27 ENCOUNTER — Other Ambulatory Visit: Payer: Self-pay | Admitting: Podiatry

## 2022-11-27 ENCOUNTER — Ambulatory Visit: Payer: PPO | Admitting: Podiatry

## 2022-11-27 DIAGNOSIS — M7752 Other enthesopathy of left foot: Secondary | ICD-10-CM | POA: Diagnosis not present

## 2022-11-27 DIAGNOSIS — S93402S Sprain of unspecified ligament of left ankle, sequela: Secondary | ICD-10-CM

## 2022-11-27 DIAGNOSIS — M79672 Pain in left foot: Secondary | ICD-10-CM

## 2022-11-27 DIAGNOSIS — B351 Tinea unguium: Secondary | ICD-10-CM

## 2022-11-27 DIAGNOSIS — Z79899 Other long term (current) drug therapy: Secondary | ICD-10-CM

## 2022-11-27 DIAGNOSIS — M779 Enthesopathy, unspecified: Secondary | ICD-10-CM | POA: Diagnosis not present

## 2022-11-27 NOTE — Progress Notes (Signed)
Subjective:  Patient ID: Colleen Torres, female    DOB: 1956/03/07,  MRN: [id number]  Chief Complaint  Patient presents with   Foot Pain    67 y.o. female presents with the above complaint.  Patient presents with left ankle pain that has been going for quite some time is progressive gotten worse.  She has tried boot shoe gear modification none of which has helped.  She sprained it back in May.  But it has not been properly immobilized.  She has not seen anyone else prior to seeing me.  The boot did not help.  She would like to discuss other treatment options as she has secondary complaint of right third fourth fifth digit nail fungus.  She states that has been present for quite some time she would like to discuss treatment options she is tried over-the-counter which has not helped.  She would like to like to discuss treatment options for this   Review of Systems: Negative except as noted in the HPI. Denies N/V/F/Ch.  Past Medical History:  Diagnosis Date   Abdominal pain, RLQ 09/26/2015   Arthritis    Bilateral Hands   Cancer (HCC)    Follicular neoplasm of thyroid   Depression    Dysrhythmia    GERD (gastroesophageal reflux disease)    Hypothyroidism     Current Outpatient Medications:    ibuprofen (ADVIL) 200 MG tablet, Take 400 mg by mouth as needed., Disp: , Rfl:    levothyroxine (SYNTHROID) 112 MCG tablet, TAKE (1) TABLET BY MOUTH ONCE DAILY., Disp: 90 tablet, Rfl: 1   Multiple Vitamins-Minerals (EYE VITAMINS PO), Take 2 capsules by mouth daily. , Disp: , Rfl:    rosuvastatin (CRESTOR) 5 MG tablet, Take 1 tablet (5 mg total) by mouth at bedtime., Disp: 90 tablet, Rfl: 1   sertraline (ZOLOFT) 25 MG tablet, Take 1 tablet (25 mg total) by mouth daily., Disp: 90 tablet, Rfl: 3   terbinafine (LAMISIL) 250 MG tablet, Take 1 tablet (250 mg total) by mouth daily., Disp: 90 tablet, Rfl: 0   terbinafine (LAMISIL) 250 MG tablet, Take 1 tablet (250 mg total) by mouth daily., Disp: 90  tablet, Rfl: 0  Social History   Tobacco Use  Smoking Status Never  Smokeless Tobacco Never    No Known Allergies Objective:  There were no vitals filed for this visit. There is no height or weight on file to calculate BMI. Constitutional Well developed. Well nourished.  Vascular Dorsalis pedis pulses palpable bilaterally. Posterior tibial pulses palpable bilaterally. Capillary refill normal to all digits.  No cyanosis or clubbing noted. Pedal hair growth normal.  Neurologic Normal speech. Oriented to person, place, and time. Epicritic sensation to light touch grossly present bilaterally.  Dermatologic Right third fourth and fifth digit thickened elongated dystrophic mycotic toenails x 3 No open wounds. No skin lesions.  Orthopedic: Pain on palpation left ankle at the ATFL ligament.  Pain with plantarflexion inversion of the foot no pain with dorsiflexion eversion of the foot no pain at the Achilles tendon peroneal tendon posterior tibial tendon.   Radiographs: 3 views of skeletally mature adult left foot: Uneven joint space narrowing noted at the ankle joint.  No osteophytes noted.  No bony abnormalities noted mild midfoot arthritis noted. Assessment:   1. Long-term use of high-risk medication   2. Left foot pain   3. Mild ankle sprain, left, sequela   4. Nail fungus   5. Onychomycosis due to dermatophyte   6. Capsulitis of  ankle, left    Plan:  Patient was evaluated and treated and all questions answered.  Left ankle capsulitis with a history of ankle sprain -All questions and concerns were discussed with the patient extensive detail given the amount of pain that she is still having from an injury that was back in May I believe she will benefit from steroid injection help decrease acute inflammatory component associate with pain.  Patient agrees with plan like to proceed with steroid injection -A steroid injection was performed at left ankle using 1% plain Lidocaine and  10 mg of Kenalog. This was well tolerated. -Tri-Lock ankle brace was dispensed  Right third fourth and fifth digit onychomycosis -Educated the patient on the etiology of onychomycosis and various treatment options associated with improving the fungal load.  I explained to the patient that there is 3 treatment options available to treat the onychomycosis including topical, p.o., laser treatment.  Patient elected to undergo p.o. options with Lamisil/terbinafine therapy.  In order for me to start the medication therapy, I explained to the patient the importance of evaluating the liver and obtaining the liver function test.  Once the liver function test comes back normal I will start him on 71-month course of Lamisil therapy.  Patient understood all risk and would like to proceed with Lamisil therapy.  I have asked the patient to immediately stop the Lamisil therapy if she has any reactions to it and call the office or go to the emergency room right away.  Patient states understanding    No follow-ups on file.

## 2022-11-30 LAB — HEPATIC FUNCTION PANEL
ALT: 25 IU/L (ref 0–32)
AST: 25 IU/L (ref 0–40)
Albumin: 4.4 g/dL (ref 3.9–4.9)
Alkaline Phosphatase: 61 IU/L (ref 44–121)
Bilirubin Total: 0.3 mg/dL (ref 0.0–1.2)
Bilirubin, Direct: 0.11 mg/dL (ref 0.00–0.40)
Total Protein: 6.5 g/dL (ref 6.0–8.5)

## 2022-11-30 LAB — SPECIMEN STATUS REPORT

## 2022-11-30 MED ORDER — TERBINAFINE HCL 250 MG PO TABS: 250.0000 mg | ORAL_TABLET | Freq: Every day | ORAL | 0 refills | Status: DC

## 2022-11-30 NOTE — Addendum Note (Signed)
Addended by: Nicholes Rough on: 11/30/2022 01:07 PM   Modules accepted: Orders

## 2022-12-08 MED ORDER — TERBINAFINE HCL 250 MG PO TABS: 250.0000 mg | ORAL_TABLET | Freq: Every day | ORAL | 0 refills | Status: DC

## 2022-12-08 NOTE — Addendum Note (Signed)
Addended by: Nicholes Rough on: 12/08/2022 07:58 AM   Modules accepted: Orders

## 2023-02-03 ENCOUNTER — Other Ambulatory Visit: Payer: Self-pay | Admitting: Nurse Practitioner

## 2023-02-24 DIAGNOSIS — Z23 Encounter for immunization: Secondary | ICD-10-CM | POA: Diagnosis not present

## 2023-04-09 ENCOUNTER — Other Ambulatory Visit: Payer: Self-pay

## 2023-04-09 DIAGNOSIS — E782 Mixed hyperlipidemia: Secondary | ICD-10-CM

## 2023-04-09 MED ORDER — ROSUVASTATIN CALCIUM 5 MG PO TABS
5.0000 mg | ORAL_TABLET | Freq: Every evening | ORAL | 0 refills | Status: DC
Start: 2023-04-09 — End: 2023-07-12

## 2023-04-15 DIAGNOSIS — E890 Postprocedural hypothyroidism: Secondary | ICD-10-CM | POA: Diagnosis not present

## 2023-04-16 LAB — LIPID PANEL
Chol/HDL Ratio: 2.9 {ratio} (ref 0.0–4.4)
Cholesterol, Total: 188 mg/dL (ref 100–199)
HDL: 65 mg/dL (ref >39)
LDL Chol Calc (NIH): 97 mg/dL (ref 0–99)
Triglycerides: 149 mg/dL (ref 0–149)
VLDL Cholesterol Cal: 26 mg/dL (ref 5–40)

## 2023-04-16 LAB — PTH, INTACT AND CALCIUM
Calcium: 8.3 mg/dL — ABNORMAL LOW (ref 8.7–10.3)
PTH: 32 pg/mL (ref 15–65)

## 2023-04-16 LAB — T4, FREE: Free T4: 1.24 ng/dL (ref 0.82–1.77)

## 2023-04-16 LAB — TSH: TSH: 5.81 u[IU]/mL — ABNORMAL HIGH (ref 0.450–4.500)

## 2023-04-21 DIAGNOSIS — Z1283 Encounter for screening for malignant neoplasm of skin: Secondary | ICD-10-CM | POA: Diagnosis not present

## 2023-04-21 DIAGNOSIS — D1801 Hemangioma of skin and subcutaneous tissue: Secondary | ICD-10-CM | POA: Diagnosis not present

## 2023-04-21 DIAGNOSIS — I781 Nevus, non-neoplastic: Secondary | ICD-10-CM | POA: Diagnosis not present

## 2023-04-22 ENCOUNTER — Ambulatory Visit: Payer: PPO | Admitting: "Endocrinology

## 2023-04-22 ENCOUNTER — Encounter: Payer: Self-pay | Admitting: "Endocrinology

## 2023-04-22 VITALS — BP 124/82 | HR 68 | Ht 69.5 in | Wt 197.8 lb

## 2023-04-22 DIAGNOSIS — M8589 Other specified disorders of bone density and structure, multiple sites: Secondary | ICD-10-CM

## 2023-04-22 DIAGNOSIS — E782 Mixed hyperlipidemia: Secondary | ICD-10-CM | POA: Diagnosis not present

## 2023-04-22 DIAGNOSIS — E890 Postprocedural hypothyroidism: Secondary | ICD-10-CM

## 2023-04-22 MED ORDER — CALCIUM CARBONATE 1250 (500 CA) MG PO TABS: 1.0000 | ORAL_TABLET | Freq: Every day | ORAL | 1 refills | Status: AC

## 2023-04-22 MED ORDER — LEVOTHYROXINE SODIUM 125 MCG PO TABS: 125.0000 ug | ORAL_TABLET | Freq: Every day | ORAL | 1 refills | Status: DC

## 2023-04-22 NOTE — Progress Notes (Signed)
 04/22/2023      Endocrinology follow-up note   Subjective:    Patient ID: Colleen Torres, female    DOB: 06/26/1955, PCP Job Bolt, PA   Past Medical History:  Diagnosis Date   Abdominal pain, RLQ 09/26/2015   Arthritis    Bilateral Hands   Cancer (HCC)    Follicular neoplasm of thyroid    Depression    Dysrhythmia    GERD (gastroesophageal reflux disease)    Hypothyroidism    Past Surgical History:  Procedure Laterality Date   APPENDECTOMY     CYST REMOVAL NECK     SUPRAVENTRICULAR TACHYCARDIA ABLATION  09/2002   THYROIDECTOMY Right 03/19/2014   Procedure: RIGHT THYROID  LOBECTOMY;  Surgeon: Oneil DELENA Budge, MD;  Location: [facility];  Service: General;  Laterality: Right;   THYROIDECTOMY, PARTIAL Left 2011   TUBAL LIGATION      Family History  Problem Relation Age of Onset   Depression Father    Anxiety disorder Father    Other Mother        hip replacement; kidney stone   Depression Brother    Anxiety disorder Brother    Miscarriages / Stillbirths Daughter    Macular degeneration Maternal Grandmother    Heart disease Maternal Grandmother    Social History   Socioeconomic History   Marital status: Married    Spouse name: Not on file   Number of children: 1   Years of education: Not on file   Highest education level: Not on file  Occupational History   Not on file  Tobacco Use   Smoking status: Never   Smokeless tobacco: Never  Vaping Use   Vaping status: Never Used  Substance and Sexual Activity   Alcohol use: No   Drug use: No   Sexual activity: Not Currently    Birth control/protection: Surgical    Comment: tubal  Other Topics Concern   Not on file  Social History Narrative   Not on file   Social Drivers of Health   Financial Resource Strain: Low Risk  (09/29/2022)   Overall Financial Resource Strain (CARDIA)    Difficulty of Paying Living Expenses: Not hard at all  Food Insecurity: No Food Insecurity (09/29/2022)   Hunger Vital Sign     Worried About Running Out of Food in the Last Year: Never true    Ran Out of Food in the Last Year: Never true  Transportation Needs: No Transportation Needs (09/29/2022)   PRAPARE - Administrator, Civil Service (Medical): No    Lack of Transportation (Non-Medical): No  Physical Activity: Inactive (09/29/2022)   Exercise Vital Sign    Days of Exercise per Week: 0 days    Minutes of Exercise per Session: 0 min  Stress: No Stress Concern Present (09/29/2022)   [employer] of Occupational Health - Occupational Stress Questionnaire    Feeling of Stress : Not at all  Social Connections: Moderately Integrated (09/29/2022)   Social Connection and Isolation Panel [NHANES]    Frequency of Communication with Friends and Family: More than three times a week    Frequency of Social Gatherings with Friends and Family: More than three times a week    Attends Religious Services: More than 4 times per year    Active Member of [employer] or Organizations: No    Attends Banker Meetings: Never    Marital Status: Married   Outpatient Encounter Medications as of 04/22/2023  Medication Sig  calcium  carbonate (OS-CAL - DOSED IN MG OF ELEMENTAL CALCIUM ) 1250 (500 Ca) MG tablet Take 1 tablet (1,250 mg total) by mouth daily with lunch.   ibuprofen (ADVIL) 200 MG tablet Take 400 mg by mouth as needed.   levothyroxine  (SYNTHROID ) 125 MCG tablet Take 1 tablet (125 mcg total) by mouth daily before breakfast.   Multiple Vitamins-Minerals (EYE VITAMINS PO) Take 2 capsules by mouth daily.    rosuvastatin  (CRESTOR ) 5 MG tablet Take 1 tablet (5 mg total) by mouth at bedtime.   sertraline  (ZOLOFT ) 25 MG tablet Take 1 tablet (25 mg total) by mouth daily.   terbinafine  (LAMISIL ) 250 MG tablet Take 1 tablet (250 mg total) by mouth daily.   [DISCONTINUED] levothyroxine  (SYNTHROID ) 112 MCG tablet TAKE (1) TABLET BY MOUTH ONCE DAILY.   [DISCONTINUED] terbinafine  (LAMISIL ) 250 MG tablet Take 1 tablet (250  mg total) by mouth daily.   No facility-administered encounter medications on file as of 04/22/2023.   ALLERGIES: No Known Allergies VACCINATION STATUS: Immunization History  Administered Date(s) Administered   Influenza,inj,Quad PF,6+ Mos 03/20/2014    HPI  68 year old female with medical history as above.   She is being seen for follow-up of hr postsurgical hypothyroidism, hypocalcemia, hyperlipidemia, osteopenia.   -  She is currently on levothyroxine  112 mcg p.o. daily before breakfast.  She reports compliance.   Her previsit thyroid  function tests are consistent with slight under replacement.    Her previsit thyroid  function tests are consistent with appropriate replacement. Her April 2023  bone density was consistent with stable/slight improvement compared to 2021 studies.    See below. She has had history of multinodular goiter , on follow-up ultrasound guided FNA which showed FLUS, and underwent completion thyroidectomy with benign outcomes- December 2015.  She underwent her first  left hemithyroidectomy in 2000 for MNG, reportedly benign.     - she denies dysphagia, shortness of breath.  She is tolerating her Crestor  for hyperlipidemia.   She presents with weight gain. -She underwent bone density study which showed osteopenia.  Review of Systems Limited as above.  Objective:    BP 124/82   Pulse 68   Ht 5' 9.5 (1.765 m)   Wt 197 lb 12.8 oz (89.7 kg)   BMI 28.79 kg/m   Wt Readings from Last 3 Encounters:  04/22/23 197 lb 12.8 oz (89.7 kg)  10/19/22 184 lb (83.5 kg)  09/29/22 180 lb (81.6 kg)       Recent Results (from the past 2160 hours)  Lipid panel     Status: None   Collection Time: 04/15/23  8:11 AM  Result Value Ref Range   Cholesterol, Total 188 100 - 199 mg/dL   Triglycerides 850 0 - 149 mg/dL   HDL 65 >60 mg/dL   VLDL Cholesterol Cal 26 5 - 40 mg/dL   LDL Chol Calc (NIH) 97 0 - 99 mg/dL   Chol/HDL Ratio 2.9 0.0 - 4.4 ratio    Comment:                                    T. Chol/HDL Ratio                                             Men  Women  1/2 Avg.Risk  3.4    3.3                                   Avg.Risk  5.0    4.4                                2X Avg.Risk  9.6    7.1                                3X Avg.Risk 23.4   11.0   TSH     Status: Abnormal   Collection Time: 04/15/23  8:11 AM  Result Value Ref Range   TSH 5.810 (H) 0.450 - 4.500 uIU/mL  T4, free     Status: None   Collection Time: 04/15/23  8:11 AM  Result Value Ref Range   Free T4 1.24 0.82 - 1.77 ng/dL  PTH, intact and calcium      Status: Abnormal   Collection Time: 04/15/23  8:11 AM  Result Value Ref Range   Calcium  8.3 (L) 8.7 - 10.3 mg/dL   PTH 32 15 - 65 pg/mL   PTH Interp Comment     Comment: Interpretation                 Intact PTH    Calcium                                  (pg/mL)      (mg/dL) Normal                          15 - 65     8.6 - 10.2 Primary Hyperparathyroidism         >65          >10.2 Secondary Hyperparathyroidism       >65          <10.2 Non-Parathyroid Hypercalcemia       <65          >10.2 Hypoparathyroidism                  <15          < 8.6 Non-Parathyroid Hypocalcemia    15 - 65          < 8.6    Bone density from July 21, 2021   ASSESSMENT: BMD as determined from Femur Neck Right is 0.774 g/cm2 with a T-Score of -1.9. This patient is considered osteopenic according to World [facility]) criteria. AP Spine L1-L3 07/21/2021 66.0 Osteopenia -1.2 1.024 g/cm2 2.6% - AP Spine L1-L3 07/17/2019 64.0 Osteopenia -1.4 0.998 g/cm2 -1.6% - AP Spine L1-L3 07/12/2017 61.9 Osteopenia -1.3 1.014 g/cm2 - -   DualFemur Neck Right 07/21/2021 66.0 Osteopenia -1.8 0.787 g/cm2 2.1% - DualFemur Neck Right 07/17/2019 64.0 Osteopenia -1.9 0.771 g/cm2 -0.4% - DualFemur Neck Right 07/12/2017 61.9 Osteopenia -1.9 0.774 g/cm2 - -   DualFemur Total Mean 07/21/2021 66.0 Osteopenia -1.3 0.849  g/cm2 -0.5% - DualFemur Total Mean 07/17/2019 64.0 Osteopenia -1.2 0.853 g/cm2 0.4% - DualFemur Total Mean 07/12/2017 61.9 Osteopenia -1.3 0.850 g/cm2 - -    Assessment & Plan:   1. Postsurgical hypothyroidism She has  history of multinodular goiter. She underwent remnant thyroidectomy on 03/19/14, no malignancy. Her previsit thyroid  function tests are consistent with under replacement.  I discussed and increased her levothyroxine  to 125 mcg p.o. daily before breakfast.     - We discussed about the correct intake of her thyroid  hormone, on empty stomach at fasting, with water, separated by at least 30 minutes from breakfast and other medications,  and separated by more than 4 hours from calcium , iron, multivitamins, acid reflux medications (PPIs). -Patient is made aware of the fact that thyroid  hormone replacement is needed for life, dose to be adjusted by periodic monitoring of thyroid  function tests.   2. Hypocalcemia -Her recent calcium  is 8.3.  She would benefit from resumption of low-dose calcium  supplement with calcium  carbonate 1250 mg p.o. daily at lunch.      3.  Osteopenia:  She has family history of osteoporosis.  Her recent bone density shows slight improvement compared to her last study in 2021.  She will not need bisphosphonate therapy. -Her FRAX fracture risk calculation reveals 10-year probability of major osteoporosis-related fracture is 10%, while her risk for hip fracture is close to 0%.   She is advised to continue vitamin D  and calcium  supplements.  Her next bone density is due before her next visit, in April 2025.   4.  Hyperlipidemia Her previsit labs show LDL improving to 97 from 118.  She is advised to continue Crestor  5 mg p.o. nightly.    - she acknowledges that there is a room for improvement in her food and drink choices. - Suggestion is made for her to avoid simple carbohydrates  from her diet including Cakes, Sweet Desserts, Ice Cream, Soda (diet and  regular), Sweet Tea, Candies, Chips, Cookies, Store Bought Juices, Alcohol , Artificial Sweeteners,  Coffee Creamer, and Sugar-free Products, Lemonade. This will help patient to have more stable blood glucose profile and potentially avoid unintended weight gain.  The following Lifestyle Medicine recommendations according to American College of Lifestyle Medicine  [facility]) were discussed and and offered to patient and she  agrees to start the journey:  A. Whole Foods, Plant-Based Nutrition comprising of fruits and vegetables, plant-based proteins, whole-grain carbohydrates was discussed in detail with the patient.   A list for source of those nutrients were also provided to the patient.  Patient will use only water or unsweetened tea for hydration. B.  The need to stay away from risky substances including alcohol, smoking; obtaining 7 to 9 hours of restorative sleep, at least 150 minutes of moderate intensity exercise weekly, the importance of healthy social connections,  and stress management techniques were discussed. C.  A full color page of  Calorie density of various food groups per pound showing examples of each food groups was provided to the patient.   - I advised patient to maintain close follow up at [facility]  for primary care needs.    I spent  25  minutes in the care of the patient today including review of labs from Thyroid  Function, CMP, and other relevant labs ; imaging/biopsy records (current and previous including abstractions from other facilities); face-to-face time discussing  her lab results and symptoms, medications doses, her options of short and long term treatment based on the latest standards of care / guidelines;   and documenting the encounter.  Colleen Torres  participated in the discussions, expressed understanding, and voiced agreement with the above plans.  All questions were answered to her satisfaction.  she is encouraged to contact clinic should she  have any questions or concerns prior to her return visit.    Follow up plan: Return in about 6 months (around 10/20/2023) for Fasting Labs  in AM B4 8, DXA Scan B4 NV.  Ranny Earl, MD Phone: [phone]  Fax: [phone]  -  This note was partially dictated with voice recognition software. Similar sounding words can be transcribed inadequately or may not  be corrected upon review.  04/22/2023, 2:10 PM

## 2023-04-27 ENCOUNTER — Ambulatory Visit
Admission: EM | Admit: 2023-04-27 | Discharge: 2023-04-27 | Disposition: A | Discharge status: Home / Self Care | Payer: PPO | Attending: Nurse Practitioner | Admitting: Nurse Practitioner

## 2023-04-27 DIAGNOSIS — J22 Unspecified acute lower respiratory infection: Secondary | ICD-10-CM

## 2023-04-27 MED ORDER — AMOXICILLIN-POT CLAVULANATE 875-125 MG PO TABS: 1.0000 | ORAL_TABLET | Freq: Two times a day (BID) | ORAL | 0 refills | Status: AC

## 2023-04-27 MED ORDER — BENZONATATE 100 MG PO CAPS: 100.0000 mg | ORAL_CAPSULE | Freq: Three times a day (TID) | ORAL | 0 refills | Status: DC | PRN

## 2023-04-27 NOTE — ED Triage Notes (Signed)
 Pt reports she has a bad cough, drainage, and fatigue x 27 days.   Took covid test but neg results

## 2023-04-27 NOTE — Discharge Instructions (Signed)
 We are treating you today for a lower respiratory infection with Augmentin  twice daily for 7 days.  In addition, recommend plenty of hydration, Mucinex 600 mg twice daily, and Tessalon  Perles every 8 hours as needed for dry cough.  You can take Tylenol  or ibuprofen as needed for pain.  You can also start running a humidifier and use nasal saline spray or rinses to help with congestion.  Seek care if symptoms do not improve with treatment.

## 2023-04-27 NOTE — ED Provider Notes (Signed)
 [facility]    CSN: [id number] Arrival date & time: 04/27/23  1009      History   Chief Complaint No chief complaint on file.   HPI Colleen Torres is a 68 y.o. female.   Patient presents today for symptoms that initially began on 03/30/2023; approximately 1 month ago.  She reports symptoms fully improved for short time and then worsened again 1 week ago.  Reports at her mother's house after eating dinner, she stood up and felt weak and a little bit dizzy.  She checked her blood pressure and it was a little bit lower than her normal at 96/69.  She denies fever, body aches or chills.  She endorses a cough that developed after the weakness that is at times congested and has wheezing in her chest at night when she lays a certain way.  Also endorses runny and stuffy nose, head pressure, fatigue, and taste being off.  No chest pain or tightness, sore throat, abdominal pain, nausea/vomiting, or diarrhea.  No known sick contacts.  Reports that she is eating and drinking but not because she feels like it, but because she knows that she needs to.  Has attempted to take sinus medication and Delsym for symptoms without much improvement in symptoms.    Past Medical History:  Diagnosis Date   Abdominal pain, RLQ 09/26/2015   Arthritis    Bilateral Hands   Cancer (HCC)    Follicular neoplasm of thyroid    Depression    Dysrhythmia    GERD (gastroesophageal reflux disease)    Hypothyroidism     Patient Active Problem List   Diagnosis Date Noted   Mixed hyperlipidemia 10/19/2022   Encounter for gynecological examination with Papanicolaou smear of cervix 09/29/2022   Class 1 obesity due to excess calories without serious comorbidity with body mass index (BMI) of 31.0 to 31.9 in adult 07/30/2021   Excessive sweating 12/20/2018   Depression 03/09/2018   Breast pain, left 03/09/2018   Osteopenia of multiple sites 12/20/2017   Postmenopausal 06/17/2017   Abdominal pain, RLQ  09/26/2015   Postsurgical hypothyroidism 05/29/2015   Hypocalcemia 05/29/2015   Neoplasm of thyroid  03/19/2014   Backache 09/11/2009    Past Surgical History:  Procedure Laterality Date   APPENDECTOMY     CYST REMOVAL NECK     SUPRAVENTRICULAR TACHYCARDIA ABLATION  09/2002   THYROIDECTOMY Right 03/19/2014   Procedure: RIGHT THYROID  LOBECTOMY;  Surgeon: Oneil DELENA Budge, MD;  Location: [facility];  Service: General;  Laterality: Right;   THYROIDECTOMY, PARTIAL Left 2011   TUBAL LIGATION      OB History     Gravida  1   Para  1   Term  1   Preterm      AB      Living  1      SAB      IAB      Ectopic      Multiple      Live Births  1            Home Medications    Prior to Admission medications   Medication Sig Start Date End Date Taking? Authorizing Provider  amoxicillin -clavulanate (AUGMENTIN ) 875-125 MG tablet Take 1 tablet by mouth 2 (two) times daily for 7 days. 04/27/23 05/04/23 Yes Chandra Harlene DELENA, NP  benzonatate  (TESSALON ) 100 MG capsule Take 1 capsule (100 mg total) by mouth 3 (three) times daily as needed for cough. Do not take with alcohol  or while operating or driving heavy machinery 8/85/74  Yes Chandra Raisin A, NP  calcium  carbonate (OS-CAL - DOSED IN MG OF ELEMENTAL CALCIUM ) 1250 (500 Ca) MG tablet Take 1 tablet (1,250 mg total) by mouth daily with lunch. 04/22/23   Nida, Gebreselassie W, MD  ibuprofen (ADVIL) 200 MG tablet Take 400 mg by mouth as needed.    [provider]  levothyroxine  (SYNTHROID ) 125 MCG tablet Take 1 tablet (125 mcg total) by mouth daily before breakfast. 04/22/23   Nida, Gebreselassie W, MD  Multiple Vitamins-Minerals (EYE VITAMINS PO) Take 2 capsules by mouth daily.     [provider]  rosuvastatin  (CRESTOR ) 5 MG tablet Take 1 tablet (5 mg total) by mouth at bedtime. 04/09/23   Nida, Gebreselassie W, MD  sertraline  (ZOLOFT ) 25 MG tablet Take 1 tablet (25 mg total) by mouth daily. 09/29/22   Signa Delon LABOR, NP  terbinafine  (LAMISIL ) 250 MG tablet Take 1 tablet (250 mg total) by mouth daily. 12/08/22   Tobie Franky SQUIBB, DPM    Family History Family History  Problem Relation Age of Onset   Depression Father    Anxiety disorder Father    Other Mother        hip replacement; kidney stone   Depression Brother    Anxiety disorder Brother    Miscarriages / Stillbirths Daughter    Macular degeneration Maternal Grandmother    Heart disease Maternal Grandmother     Social History Social History   Tobacco Use   Smoking status: Never   Smokeless tobacco: Never  Vaping Use   Vaping status: Never Used  Substance Use Topics   Alcohol use: No   Drug use: No     Allergies   Patient has no known allergies.   Review of Systems Review of Systems Per HPI  Physical Exam Triage Vital Signs ED Triage Vitals  Encounter Vitals Group     BP 04/27/23 1017 121/75     Systolic BP Percentile --      Diastolic BP Percentile --      Pulse Rate 04/27/23 1017 80     Resp 04/27/23 1017 18     Temp 04/27/23 1017 98.8 F (37.1 C)     Temp Source 04/27/23 1017 Oral     SpO2 04/27/23 1017 97 %     Weight --      Height --      Head Circumference --      Peak Flow --      Pain Score 04/27/23 1018 0     Pain Loc --      Pain Education --      Exclude from Growth Chart --    No data found.  Updated Vital Signs BP 121/75 (BP Location: Right Arm)   Pulse 80   Temp 98.8 F (37.1 C) (Oral)   Resp 18   SpO2 97%   Visual Acuity Right Eye Distance:   Left Eye Distance:   Bilateral Distance:    Right Eye Near:   Left Eye Near:    Bilateral Near:     Physical Exam Vitals and nursing note reviewed.  Constitutional:      General: She is not in acute distress.    Appearance: Normal appearance. She is not ill-appearing or toxic-appearing.  HENT:     Head: Normocephalic and atraumatic.     Right Ear: Tympanic membrane, ear canal and external ear normal.     Left Ear: Tympanic  membrane, ear canal and external ear normal.     Nose: No congestion or rhinorrhea.     Right Sinus: No maxillary sinus tenderness or frontal sinus tenderness.     Left Sinus: No maxillary sinus tenderness or frontal sinus tenderness.     Mouth/Throat:     Mouth: Mucous membranes are moist.     Pharynx: Oropharynx is clear. Posterior oropharyngeal erythema present. No oropharyngeal exudate.     Comments: Post nasal drainage Eyes:     General: No scleral icterus.    Extraocular Movements: Extraocular movements intact.  Cardiovascular:     Rate and Rhythm: Normal rate and regular rhythm.  Pulmonary:     Effort: Pulmonary effort is normal. No respiratory distress.     Breath sounds: Normal breath sounds. No wheezing, rhonchi or rales.  Musculoskeletal:     Cervical back: Normal range of motion and neck supple.  Lymphadenopathy:     Cervical: No cervical adenopathy.  Skin:    General: Skin is warm and dry.     Coloration: Skin is not jaundiced or pale.     Findings: No erythema or rash.  Neurological:     Mental Status: She is alert and oriented to person, place, and time.  Psychiatric:        Behavior: Behavior is cooperative.      UC Treatments / Results  Labs (all labs ordered are listed, but only abnormal results are displayed) Labs Reviewed - No data to display  EKG   Radiology No results found.  Procedures Procedures (including critical care time)  Medications Ordered in UC Medications - No data to display  Initial Impression / Assessment and Plan / UC Course  I have reviewed the triage vital signs and the nursing notes.  Pertinent labs & imaging results that were available during my care of the patient were reviewed by me and considered in my medical decision making (see chart for details).   Patient is well-appearing, normotensive, afebrile, not tachycardic, not tachypneic, oxygenating well on room air.    1. Acute lower respiratory infection Overall,  vitals and exam are stable and reassuring I am concerned for possible lower respiratory infection; unable to perform x-ray imaging in urgent care today due to staff shortages; outpatient x-ray deferred by patient which is reasonable Will treat empirically with Augmentin  twice daily for 7 days, other supportive care discussed including cough suppressant medication and Mucinex ER and return precautions discussed with patient  The patient was given the opportunity to ask questions.  All questions answered to their satisfaction.  The patient is in agreement to this plan.    Final Clinical Impressions(s) / UC Diagnoses   Final diagnoses:  Acute lower respiratory infection     Discharge Instructions      We are treating you today for a lower respiratory infection with Augmentin  twice daily for 7 days.  In addition, recommend plenty of hydration, Mucinex 600 mg twice daily, and Tessalon  Perles every 8 hours as needed for dry cough.  You can take Tylenol  or ibuprofen as needed for pain.  You can also start running a humidifier and use nasal saline spray or rinses to help with congestion.  Seek care if symptoms do not improve with treatment.     ED Prescriptions     Medication Sig Dispense Auth. Provider   amoxicillin -clavulanate (AUGMENTIN ) 875-125 MG tablet Take 1 tablet by mouth 2 (two) times daily for 7 days. 14 tablet Chandra Harlene LABOR, NP  benzonatate  (TESSALON ) 100 MG capsule Take 1 capsule (100 mg total) by mouth 3 (three) times daily as needed for cough. Do not take with alcohol or while operating or driving heavy machinery 21 capsule Chandra Harlene LABOR, NP      PDMP not reviewed this encounter.   Chandra Harlene LABOR, NP 04/27/23 1114

## 2023-06-26 LAB — MM 3D SCREENING MAMMOGRAM BILATERAL BREAST: HM Mammogram: NORMAL (ref 0–4)

## 2023-07-08 ENCOUNTER — Other Ambulatory Visit: Payer: Self-pay | Admitting: Nurse Practitioner

## 2023-07-12 ENCOUNTER — Other Ambulatory Visit: Payer: Self-pay | Admitting: "Endocrinology

## 2023-07-12 DIAGNOSIS — E782 Mixed hyperlipidemia: Secondary | ICD-10-CM

## 2023-08-11 ENCOUNTER — Ambulatory Visit (HOSPITAL_COMMUNITY)
Admission: RE | Admit: 2023-08-11 | Discharge: 2023-08-11 | Disposition: A | Discharge status: Home / Self Care | Payer: PPO | Source: Ambulatory Visit | Attending: "Endocrinology | Admitting: "Endocrinology

## 2023-08-11 DIAGNOSIS — Z780 Asymptomatic menopausal state: Secondary | ICD-10-CM | POA: Diagnosis not present

## 2023-08-11 DIAGNOSIS — M8589 Other specified disorders of bone density and structure, multiple sites: Secondary | ICD-10-CM | POA: Insufficient documentation

## 2023-08-18 DIAGNOSIS — Z1231 Encounter for screening mammogram for malignant neoplasm of breast: Secondary | ICD-10-CM | POA: Diagnosis not present

## 2023-09-30 ENCOUNTER — Other Ambulatory Visit: Payer: Self-pay | Admitting: Adult Health

## 2023-09-30 ENCOUNTER — Other Ambulatory Visit: Payer: Self-pay | Admitting: "Endocrinology

## 2023-10-06 ENCOUNTER — Ambulatory Visit: Admitting: Adult Health

## 2023-10-06 ENCOUNTER — Encounter: Payer: Self-pay | Admitting: Adult Health

## 2023-10-06 VITALS — BP 103/73 | HR 69 | Ht 69.5 in | Wt 200.0 lb

## 2023-10-06 DIAGNOSIS — Z780 Asymptomatic menopausal state: Secondary | ICD-10-CM

## 2023-10-06 DIAGNOSIS — Z01419 Encounter for gynecological examination (general) (routine) without abnormal findings: Secondary | ICD-10-CM | POA: Diagnosis not present

## 2023-10-06 DIAGNOSIS — F32A Depression, unspecified: Secondary | ICD-10-CM

## 2023-10-06 DIAGNOSIS — Z1331 Encounter for screening for depression: Secondary | ICD-10-CM

## 2023-10-06 DIAGNOSIS — Z1211 Encounter for screening for malignant neoplasm of colon: Secondary | ICD-10-CM | POA: Diagnosis not present

## 2023-10-06 LAB — HEMOCCULT GUIAC POC 1CARD (OFFICE): Fecal Occult Blood, POC: NEGATIVE

## 2023-10-06 NOTE — Progress Notes (Signed)
 Patient ID: Colleen Torres, female   DOB: 1955/08/20, 68 y.o.   MRN: [id number] History of Present Illness: Colleen Torres is a 68 year old white female, married, PM in for a well woman gyn exam.     Component Value Date/Time   DIAGPAP  09/29/2022 1507    - Negative for intraepithelial lesion or malignancy (NILM)   DIAGPAP  03/28/2019 0933    - Negative for intraepithelial lesion or malignancy (NILM)   HPVHIGH Negative 09/29/2022 1507   HPVHIGH Negative 03/28/2019 0933   ADEQPAP  09/29/2022 1507    Satisfactory for evaluation; transformation zone component PRESENT.   ADEQPAP  03/28/2019 0933    Satisfactory for evaluation. The presence or absence of an   ADEQPAP  03/28/2019 0933    endocervical/transformation zone component cannot be determined because   ADEQPAP of atrophy. 03/28/2019 0933   PCP is CHRISTELLA Buttner PA   Current Medications, Allergies, Past Medical History, Past Surgical History, Family History and Social History were reviewed in [employer] record.     Review of Systems: Patient denies any headaches, hearing loss, fatigue, blurred vision, shortness of breath, chest pain, abdominal pain, problems with bowel movements, urination, or intercourse(not active). No joint pain or mood swings.  Denies any vaginal bleeding    Physical Exam:BP 103/73 (BP Location: Left Arm, Patient Position: Sitting, Cuff Size: Large)   Pulse 69   Ht 5' 9.5 (1.765 m)   Wt 200 lb (90.7 kg)   BMI 29.11 kg/m   General:  Well developed, well nourished, no acute distress Skin:  Warm and dry Neck:  Midline trachea, surgically  absent thyroid , good ROM, no lymphadenopathy,no carotid bruits heard  Lungs; Clear to auscultation bilaterally Breast:  No dominant palpable mass, retraction, or nipple discharge Cardiovascular: Regular rate and rhythm Abdomen:  Soft, non tender, no hepatosplenomegaly Pelvic:  External genitalia is normal in appearance, no lesions.  The vagina is pale. Urethra  has no lesions or masses. The cervix is smooth.  Uterus is felt to be normal size, shape, and contour.  No adnexal masses or tenderness noted.Bladder is non tender, no masses felt. Rectal: Good sphincter tone, no polyps, or hemorrhoids felt.  Hemoccult negative. Extremities/musculoskeletal:  No swelling or varicosities noted, no clubbing or cyanosis Psych:  No mood changes, alert and cooperative,seems happy AA is 0 Fall risk is low    10/06/2023   11:24 AM 09/29/2022    2:59 PM 03/28/2019    9:33 AM  Depression screen PHQ 2/9  Decreased Interest 0 0 0  Down, Depressed, Hopeless 0 0 0  PHQ - 2 Score 0 0 0  Altered sleeping 0 0 0  Tired, decreased energy 3 1 1   Change in appetite 0 0 0  Feeling bad or failure about yourself  0 0 0  Trouble concentrating 0 1 1  Moving slowly or fidgety/restless 0 0 0  Suicidal thoughts 0 0 0  PHQ-9 Score 3 2 2   Difficult doing work/chores   Not difficult at all       10/06/2023   11:24 AM 09/29/2022    2:59 PM  GAD 7 : Generalized Anxiety Score  Nervous, Anxious, on Edge 0 0  Control/stop worrying 0 0  Worry too much - different things 0 0  Trouble relaxing 0 0  Restless 0 0  Easily annoyed or irritable 0 0  Afraid - awful might happen 0 0  Total GAD 7 Score 0 0    Upstream -  10/06/23 1116       Pregnancy Intention Screening   Does the patient want to become pregnant in the next year? No    Does the patient's partner want to become pregnant in the next year? No    Would the patient like to discuss contraceptive options today? No      Contraception Wrap Up   Current Method Female Sterilization    End Method Female Sterilization    Contraception Counseling Provided No           Examination chaperoned by Clarita Salt LPN   Impression and Plan: 1. Encounter for well woman exam with routine gynecological exam (Primary) Physical in 1 year Pap in 2027 Labs with PCP Mammogram was negative at [facility] this year Colonoscopy per GI    2. Encounter for screening fecal occult blood testing Hemoccult was negative  - POCT occult blood stool  3. Postmenopausal Denies any bleeding   4. Depression, unspecified depression type Doing well on zoloft  25 mg has refills

## 2023-10-08 ENCOUNTER — Other Ambulatory Visit: Payer: Self-pay | Admitting: "Endocrinology

## 2023-10-08 DIAGNOSIS — E782 Mixed hyperlipidemia: Secondary | ICD-10-CM

## 2023-10-14 DIAGNOSIS — E890 Postprocedural hypothyroidism: Secondary | ICD-10-CM | POA: Diagnosis not present

## 2023-10-14 DIAGNOSIS — E782 Mixed hyperlipidemia: Secondary | ICD-10-CM | POA: Diagnosis not present

## 2023-10-15 LAB — LIPID PANEL
Chol/HDL Ratio: 3.8 ratio (ref 0.0–4.4)
Cholesterol, Total: 222 mg/dL — ABNORMAL HIGH (ref 100–199)
HDL: 58 mg/dL (ref >39)
LDL Chol Calc (NIH): 122 mg/dL — ABNORMAL HIGH (ref 0–99)
Triglycerides: 240 mg/dL — ABNORMAL HIGH (ref 0–149)
VLDL Cholesterol Cal: 42 mg/dL — ABNORMAL HIGH (ref 5–40)

## 2023-10-15 LAB — T4, FREE: Free T4: 1.46 ng/dL (ref 0.82–1.77)

## 2023-10-15 LAB — COMPREHENSIVE METABOLIC PANEL WITH GFR
ALT: 27 IU/L (ref 0–32)
AST: 25 IU/L (ref 0–40)
Albumin: 4.5 g/dL (ref 3.9–4.9)
Alkaline Phosphatase: 67 IU/L (ref 44–121)
BUN/Creatinine Ratio: 12 (ref 12–28)
BUN: 11 mg/dL (ref 8–27)
Bilirubin Total: 0.4 mg/dL (ref 0.0–1.2)
CO2: 25 mmol/L (ref 20–29)
Calcium: 9.1 mg/dL (ref 8.7–10.3)
Chloride: 102 mmol/L (ref 96–106)
Creatinine, Ser: 0.89 mg/dL (ref 0.57–1.00)
Globulin, Total: 2.3 g/dL (ref 1.5–4.5)
Glucose: 94 mg/dL (ref 70–99)
Potassium: 4.6 mmol/L (ref 3.5–5.2)
Sodium: 142 mmol/L (ref 134–144)
Total Protein: 6.8 g/dL (ref 6.0–8.5)
eGFR: 71 mL/min/1.73 (ref >59)

## 2023-10-15 LAB — TSH: TSH: 1.25 u[IU]/mL (ref 0.450–4.500)

## 2023-10-20 ENCOUNTER — Encounter: Payer: Self-pay | Admitting: "Endocrinology

## 2023-10-20 ENCOUNTER — Ambulatory Visit: Payer: PPO | Admitting: "Endocrinology

## 2023-10-20 VITALS — BP 112/52 | HR 80 | Ht 69.5 in | Wt 201.2 lb

## 2023-10-20 DIAGNOSIS — M8589 Other specified disorders of bone density and structure, multiple sites: Secondary | ICD-10-CM

## 2023-10-20 DIAGNOSIS — E890 Postprocedural hypothyroidism: Secondary | ICD-10-CM | POA: Diagnosis not present

## 2023-10-20 DIAGNOSIS — E782 Mixed hyperlipidemia: Secondary | ICD-10-CM

## 2023-10-20 MED ORDER — ALENDRONATE SODIUM 70 MG PO TABS: 70.0000 mg | ORAL_TABLET | ORAL | 11 refills | Status: AC

## 2023-10-20 MED ORDER — ROSUVASTATIN CALCIUM 10 MG PO TABS: 10.0000 mg | ORAL_TABLET | Freq: Every evening | ORAL | 1 refills | Status: DC

## 2023-10-20 NOTE — Progress Notes (Signed)
 10/20/2023      Endocrinology follow-up note   Subjective:    Patient ID: Colleen Torres, female    DOB: 08-Aug-1955, PCP Job Bolt, PA   Past Medical History:  Diagnosis Date   Abdominal pain, RLQ 09/26/2015   Arthritis    Bilateral Hands   Cancer (HCC)    Follicular neoplasm of thyroid    Depression    Dysrhythmia    GERD (gastroesophageal reflux disease)    Hypothyroidism    Past Surgical History:  Procedure Laterality Date   APPENDECTOMY     CYST REMOVAL NECK     SUPRAVENTRICULAR TACHYCARDIA ABLATION  09/2002   THYROIDECTOMY Right 03/19/2014   Procedure: RIGHT THYROID  LOBECTOMY;  Surgeon: Oneil DELENA Budge, MD;  Location: [facility];  Service: General;  Laterality: Right;   THYROIDECTOMY, PARTIAL Left 2011   TUBAL LIGATION      Family History  Problem Relation Age of Onset   Depression Father    Anxiety disorder Father    Other Mother        hip replacement; kidney stone   Depression Brother    Anxiety disorder Brother    Miscarriages / Stillbirths Daughter    Macular degeneration Maternal Grandmother    Heart disease Maternal Grandmother    Social History   Socioeconomic History   Marital status: Married    Spouse name: Not on file   Number of children: 1   Years of education: Not on file   Highest education level: Not on file  Occupational History   Not on file  Tobacco Use   Smoking status: Never   Smokeless tobacco: Never  Vaping Use   Vaping status: Never Used  Substance and Sexual Activity   Alcohol use: No   Drug use: No   Sexual activity: Not Currently    Birth control/protection: Surgical, Post-menopausal    Comment: tubal  Other Topics Concern   Not on file  Social History Narrative   Not on file   Social Drivers of Health   Financial Resource Strain: Low Risk  (10/06/2023)   Overall Financial Resource Strain (CARDIA)    Difficulty of Paying Living Expenses: Not hard at all  Food Insecurity: No Food Insecurity (10/06/2023)   Hunger  Vital Sign    Worried About Running Out of Food in the Last Year: Never true    Ran Out of Food in the Last Year: Never true  Transportation Needs: No Transportation Needs (10/06/2023)   PRAPARE - Administrator, Civil Service (Medical): No    Lack of Transportation (Non-Medical): No  Physical Activity: Inactive (10/06/2023)   Exercise Vital Sign    Days of Exercise per Week: 0 days    Minutes of Exercise per Session: 0 min  Stress: No Stress Concern Present (10/06/2023)   [employer] of Occupational Health - Occupational Stress Questionnaire    Feeling of Stress: Not at all  Social Connections: Socially Integrated (10/06/2023)   Social Connection and Isolation Panel    Frequency of Communication with Friends and Family: More than three times a week    Frequency of Social Gatherings with Friends and Family: More than three times a week    Attends Religious Services: More than 4 times per year    Active Member of [employer] or Organizations: Yes    Attends Banker Meetings: More than 4 times per year    Marital Status: Married   Outpatient Encounter Medications as of 10/20/2023  Medication Sig   alendronate  (FOSAMAX ) 70 MG tablet Take 1 tablet (70 mg total) by mouth every 7 (seven) days. Take with a full glass of water on an empty stomach.   calcium  carbonate (OS-CAL - DOSED IN MG OF ELEMENTAL CALCIUM ) 1250 (500 Ca) MG tablet Take 1 tablet (1,250 mg total) by mouth daily with lunch.   Chlorpheniramine Maleate (ALLERGY PO) Take by mouth.   levothyroxine  (SYNTHROID ) 125 MCG tablet Take 1 tablet (125 mcg total) by mouth daily before breakfast.   Multiple Vitamins-Minerals (EYE VITAMINS PO) Take 2 capsules by mouth daily.    Multiple Vitamins-Minerals (ZINC PO) Take by mouth.   rosuvastatin  (CRESTOR ) 10 MG tablet Take 1 tablet (10 mg total) by mouth at bedtime.   sertraline  (ZOLOFT ) 25 MG tablet TAKE 1 TABLET BY MOUTH ONCE A DAY.   [DISCONTINUED] rosuvastatin  (CRESTOR )  5 MG tablet Take 1 tablet (5 mg total) by mouth at bedtime.   No facility-administered encounter medications on file as of 10/20/2023.   ALLERGIES: No Known Allergies VACCINATION STATUS: Immunization History  Administered Date(s) Administered   Influenza,inj,Quad PF,6+ Mos 03/20/2014    HPI  68 year old female with medical history as above.   She is being seen for follow-up of hr postsurgical hypothyroidism, hypocalcemia, hyperlipidemia, osteopenia.   -  She is currently on levothyroxine  125 mcg p.o. daily before breakfast.  She reports compliance and consistency.  Her previsit thyroid  function tests are consistent with appropriate replacement.    Her previsit thyroid  function tests are consistent with appropriate replacement.  She is also on Crestor  5 mg p.o. nightly for hyperlipidemia.  She is tolerating her Crestor  for hyperlipidemia, no new complaints today. Her April 30th 2025 bone density showed worsening bone density profile with increased fracture risk-17.1% risk for major osteoporosis fracture and 2.6% risk for hip fracture.  No history of fragility fracture.  She does have family history of hip fracture in her mother. See study summarized below. She has had history of multinodular goiter , on follow-up ultrasound guided FNA which showed FLUS, and underwent completion thyroidectomy with benign outcomes- December 2015.  She underwent her first  left hemithyroidectomy in 2000 for MNG, reportedly benign.    - she denies dysphagia, shortness of breath.    Review of Systems Limited as above.  Objective:    BP (!) 112/52   Pulse 80   Ht 5' 9.5 (1.765 m)   Wt 201 lb 3.2 oz (91.3 kg)   BMI 29.29 kg/m   Wt Readings from Last 3 Encounters:  10/20/23 201 lb 3.2 oz (91.3 kg)  10/06/23 200 lb (90.7 kg)  04/22/23 197 lb 12.8 oz (89.7 kg)       Recent Results (from the past 2160 hours)  POCT occult blood stool     Status: None   Collection Time: 10/06/23 11:45 AM  Result  Value Ref Range   Fecal Occult Blood, POC Negative Negative   Card #1 Date     Card #2 Fecal Occult Blod, POC     Card #2 Date     Card #3 Fecal Occult Blood, POC     Card #3 Date    Lipid panel     Status: Abnormal   Collection Time: 10/14/23  8:16 AM  Result Value Ref Range   Cholesterol, Total 222 (H) 100 - 199 mg/dL   Triglycerides 759 (H) 0 - 149 mg/dL   HDL 58 >60 mg/dL   VLDL Cholesterol Cal 42 (H) 5 - 40  mg/dL   LDL Chol Calc (NIH) 877 (H) 0 - 99 mg/dL   Chol/HDL Ratio 3.8 0.0 - 4.4 ratio    Comment:                                   T. Chol/HDL Ratio                                             Men  Women                               1/2 Avg.Risk  3.4    3.3                                   Avg.Risk  5.0    4.4                                2X Avg.Risk  9.6    7.1                                3X Avg.Risk 23.4   11.0   TSH     Status: None   Collection Time: 10/14/23  8:16 AM  Result Value Ref Range   TSH 1.250 0.450 - 4.500 uIU/mL  T4, free     Status: None   Collection Time: 10/14/23  8:16 AM  Result Value Ref Range   Free T4 1.46 0.82 - 1.77 ng/dL  Comprehensive metabolic panel     Status: None   Collection Time: 10/14/23  8:16 AM  Result Value Ref Range   Glucose 94 70 - 99 mg/dL   BUN 11 8 - 27 mg/dL   Creatinine, Ser 9.10 0.57 - 1.00 mg/dL   eGFR 71 >40 fO/fpw/8.26   BUN/Creatinine Ratio 12 12 - 28   Sodium 142 134 - 144 mmol/L   Potassium 4.6 3.5 - 5.2 mmol/L   Chloride 102 96 - 106 mmol/L   CO2 25 20 - 29 mmol/L   Calcium  9.1 8.7 - 10.3 mg/dL   Total Protein 6.8 6.0 - 8.5 g/dL   Albumin 4.5 3.9 - 4.9 g/dL   Globulin, Total 2.3 1.5 - 4.5 g/dL   Bilirubin Total 0.4 0.0 - 1.2 mg/dL   Alkaline Phosphatase 67 44 - 121 IU/L   AST 25 0 - 40 IU/L   ALT 27 0 - 32 IU/L   Bone density from July 21, 2021   ASSESSMENT: BMD as determined from Femur Neck Right is 0.774 g/cm2 with a T-Score of -1.9. This patient is considered osteopenic according  to World Health Organization [facility]) criteria. AP Spine L1-L3 07/21/2021 66.0 Osteopenia -1.2 1.024 g/cm2 2.6% - AP Spine L1-L3 07/17/2019 64.0 Osteopenia -1.4 0.998 g/cm2 -1.6% - AP Spine L1-L3 07/12/2017 61.9 Osteopenia -1.3 1.014 g/cm2 - -   DualFemur Neck Right 07/21/2021 66.0 Osteopenia -1.8 0.787 g/cm2 2.1% - DualFemur Neck Right 07/17/2019 64.0 Osteopenia -1.9 0.771 g/cm2 -0.4% - DualFemur Neck Right 07/12/2017 61.9 Osteopenia -1.9 0.774 g/cm2 - -   DualFemur Total Mean  07/21/2021 66.0 Osteopenia -1.3 0.849 g/cm2 -0.5% - DualFemur Total Mean 07/17/2019 64.0 Osteopenia -1.2 0.853 g/cm2 0.4% - DualFemur Total Mean 07/12/2017 61.9 Osteopenia -1.3 0.850 g/cm2 -   Findings of her bone density study from August 11, 2023  FRAX 10-YEAR PROBABILITY OF FRACTURE:   10-year fracture risk is performed using the [facility] FRAX calculator based on patient-reported risk factors.    Major osteoporotic fracture: 17.1% Hip fracture: 2.6%   Other situations known to alter the reliability of the FRAX score should be considered when making treatment decisions, including chronic glucocorticoid use and past treatments. Further guidance on treatment can be found at the [facility] Foundation's website [email or web address]   IMPRESSION: Osteopenia based on BMD.   Fracture risk is increased. Increased risk is based on low BMD and history of fragility fracture.  Assessment & Plan:   1. Postsurgical hypothyroidism She has history of multinodular goiter. She underwent remnant thyroidectomy on 03/19/14, no malignancy. Her previsit thyroid  function tests are consistent with appropriate replacement.  She is advised to continue levothyroxine  125 mcg p.o. daily before breakfast.     - We discussed about the correct intake of her thyroid  hormone, on empty stomach at fasting, with water, separated by at least 30 minutes from breakfast and other medications,  and separated by more than  4 hours from calcium , iron, multivitamins, acid reflux medications (PPIs). -Patient is made aware of the fact that thyroid  hormone replacement is needed for life, dose to be adjusted by periodic monitoring of thyroid  function tests.  2. Hypocalcemia -Her recent calcium  is 9.1.  She will continue to benefit from  low-dose calcium  supplement with calcium  carbonate 1250 mg p.o. daily at lunch.      3.  Osteopenia:  She has family history of osteoporosis and hip fracture.  Her recent bone density from April 2025 showed worsening bone density profile.  Considering her FRAX fracture risk calculation revealing 10-year probability of major osteoporosis related fracture at 17.1% fracture risk at 2.6%, she is approached for early intervention with antiresorptive treatment and she agrees.   She does not have contraindication for use of bisphosphonates.  No dental concerns or plans. I discussed and prescribed alendronate  70 mg p.o. weekly.  Side effects and precautions discussed with her.   She is advised to continue vitamin D  and calcium  supplements.  Her next bone density is due in April 2027.     4.  Hyperlipidemia Her previsit labs show LDL increasing to 122 from 97.  Discussed and increase her Crestor  to 10 mg p.o. nightly.  - she acknowledges that there is a room for improvement in her food and drink choices. - Suggestion is made for her to avoid simple carbohydrates  from her diet including Cakes, Sweet Desserts, Ice Cream, Soda (diet and regular), Sweet Tea, Candies, Chips, Cookies, Store Bought Juices, Alcohol , Artificial Sweeteners,  Coffee Creamer, and Sugar-free Products, Lemonade. This will help patient to have more stable blood glucose profile and potentially avoid unintended weight gain.   - I advised patient to maintain close follow up at [facility]  for primary care needs.   I spent  25  minutes in the care of the patient today including review of labs from Thyroid   Function, CMP, and other relevant labs ; imaging/biopsy records (current and previous including abstractions from other facilities); face-to-face time discussing  her lab results and symptoms, medications doses, her options of short and long term treatment based on the latest standards  of care / guidelines;   and documenting the encounter.  Colleen Torres  participated in the discussions, expressed understanding, and voiced agreement with the above plans.  All questions were answered to her satisfaction. she is encouraged to contact clinic should she have any questions or concerns prior to her return visit.   Follow up plan: Return in about 6 months (around 04/21/2024) for Fasting Labs  in AM B4 8.  Ranny Earl, MD Phone: [phone]  Fax: [phone]  -  This note was partially dictated with voice recognition software. Similar sounding words can be transcribed inadequately or may not  be corrected upon review.  10/20/2023, 11:21 AM

## 2024-03-06 DIAGNOSIS — Z23 Encounter for immunization: Secondary | ICD-10-CM | POA: Diagnosis not present

## 2024-03-15 DIAGNOSIS — R600 Localized edema: Secondary | ICD-10-CM | POA: Diagnosis not present

## 2024-03-15 DIAGNOSIS — R61 Generalized hyperhidrosis: Secondary | ICD-10-CM | POA: Diagnosis not present

## 2024-03-15 DIAGNOSIS — Z118 Encounter for screening for other infectious and parasitic diseases: Secondary | ICD-10-CM | POA: Diagnosis not present

## 2024-03-15 DIAGNOSIS — E7849 Other hyperlipidemia: Secondary | ICD-10-CM | POA: Diagnosis not present

## 2024-03-15 DIAGNOSIS — R0602 Shortness of breath: Secondary | ICD-10-CM | POA: Diagnosis not present

## 2024-03-15 DIAGNOSIS — Z6829 Body mass index (BMI) 29.0-29.9, adult: Secondary | ICD-10-CM | POA: Diagnosis not present

## 2024-03-15 DIAGNOSIS — R5383 Other fatigue: Secondary | ICD-10-CM | POA: Diagnosis not present

## 2024-03-15 DIAGNOSIS — E039 Hypothyroidism, unspecified: Secondary | ICD-10-CM | POA: Diagnosis not present

## 2024-03-30 ENCOUNTER — Other Ambulatory Visit: Payer: Self-pay | Admitting: "Endocrinology

## 2024-03-30 ENCOUNTER — Other Ambulatory Visit: Payer: Self-pay

## 2024-04-04 ENCOUNTER — Telehealth: Payer: Self-pay | Admitting: "Endocrinology

## 2024-04-04 NOTE — Telephone Encounter (Signed)
 Pt lvm that her family doctor would now be monitoring her bloodwork and such and to cancel her appt and she did not wish to reschedule

## 2024-04-07 ENCOUNTER — Other Ambulatory Visit: Payer: Self-pay | Admitting: "Endocrinology

## 2024-04-07 DIAGNOSIS — E782 Mixed hyperlipidemia: Secondary | ICD-10-CM

## 2024-04-21 ENCOUNTER — Ambulatory Visit: Admitting: "Endocrinology
